# Patient Record
Sex: Female | Born: 1980
Health system: Southern US, Community
[De-identification: ages and names within clinical notes are randomized; demographics above are authoritative.]

## PROBLEM LIST (undated history)

## (undated) DIAGNOSIS — A599 Trichomoniasis, unspecified: Secondary | ICD-10-CM

## (undated) DIAGNOSIS — R87629 Unspecified abnormal cytological findings in specimens from vagina: Secondary | ICD-10-CM

## (undated) DIAGNOSIS — Z789 Other specified health status: Secondary | ICD-10-CM

## (undated) HISTORY — PX: NO PAST SURGERIES: SHX2092

---
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
## 2001-01-06 ENCOUNTER — Encounter: Admission: RE | Admit: 2001-01-06 | Discharge: 2001-01-06 | Payer: Self-pay | Admitting: Family Medicine

## 2001-01-13 ENCOUNTER — Encounter: Admission: RE | Admit: 2001-01-13 | Discharge: 2001-01-13 | Payer: Self-pay | Admitting: Family Medicine

## 2001-02-01 ENCOUNTER — Encounter: Admission: RE | Admit: 2001-02-01 | Discharge: 2001-02-01 | Payer: Self-pay | Admitting: Family Medicine

## 2001-02-01 ENCOUNTER — Other Ambulatory Visit: Admission: RE | Admit: 2001-02-01 | Discharge: 2001-02-01 | Payer: Self-pay | Admitting: Family Medicine

## 2001-03-01 ENCOUNTER — Encounter: Payer: Self-pay | Admitting: Emergency Medicine

## 2001-03-01 ENCOUNTER — Emergency Department (HOSPITAL_COMMUNITY): Admission: EM | Admit: 2001-03-01 | Discharge: 2001-03-01 | Payer: Self-pay | Admitting: Emergency Medicine

## 2001-04-16 ENCOUNTER — Emergency Department (HOSPITAL_COMMUNITY): Admission: EM | Admit: 2001-04-16 | Discharge: 2001-04-16 | Payer: Self-pay | Admitting: Emergency Medicine

## 2001-05-17 ENCOUNTER — Inpatient Hospital Stay (HOSPITAL_COMMUNITY): Admission: AD | Admit: 2001-05-17 | Discharge: 2001-05-17 | Payer: Self-pay | Admitting: *Deleted

## 2002-01-20 ENCOUNTER — Encounter: Admission: RE | Admit: 2002-01-20 | Discharge: 2002-01-20 | Payer: Self-pay | Admitting: Family Medicine

## 2002-03-07 ENCOUNTER — Encounter: Admission: RE | Admit: 2002-03-07 | Discharge: 2002-03-07 | Payer: Self-pay | Admitting: Family Medicine

## 2002-04-05 ENCOUNTER — Ambulatory Visit (HOSPITAL_COMMUNITY): Admission: RE | Admit: 2002-04-05 | Discharge: 2002-04-05 | Payer: Self-pay | Admitting: Family Medicine

## 2002-05-03 ENCOUNTER — Encounter: Admission: RE | Admit: 2002-05-03 | Discharge: 2002-05-03 | Payer: Self-pay | Admitting: Family Medicine

## 2002-05-29 ENCOUNTER — Encounter: Admission: RE | Admit: 2002-05-29 | Discharge: 2002-05-29 | Payer: Self-pay | Admitting: Family Medicine

## 2002-06-12 ENCOUNTER — Encounter: Admission: RE | Admit: 2002-06-12 | Discharge: 2002-06-12 | Payer: Self-pay | Admitting: Family Medicine

## 2002-06-26 ENCOUNTER — Encounter: Admission: RE | Admit: 2002-06-26 | Discharge: 2002-06-26 | Payer: Self-pay | Admitting: Sports Medicine

## 2002-07-24 ENCOUNTER — Encounter: Admission: RE | Admit: 2002-07-24 | Discharge: 2002-07-24 | Payer: Self-pay | Admitting: Family Medicine

## 2002-08-07 ENCOUNTER — Encounter: Admission: RE | Admit: 2002-08-07 | Discharge: 2002-08-07 | Payer: Self-pay | Admitting: Family Medicine

## 2002-08-11 ENCOUNTER — Encounter (INDEPENDENT_AMBULATORY_CARE_PROVIDER_SITE_OTHER): Payer: Self-pay

## 2002-08-11 ENCOUNTER — Inpatient Hospital Stay (HOSPITAL_COMMUNITY): Admission: AD | Admit: 2002-08-11 | Discharge: 2002-08-13 | Payer: Self-pay | Admitting: Obstetrics and Gynecology

## 2002-12-29 ENCOUNTER — Encounter: Admission: RE | Admit: 2002-12-29 | Discharge: 2002-12-29 | Payer: Self-pay | Admitting: Family Medicine

## 2003-01-12 ENCOUNTER — Encounter: Admission: RE | Admit: 2003-01-12 | Discharge: 2003-01-12 | Payer: Self-pay | Admitting: Family Medicine

## 2003-07-06 ENCOUNTER — Encounter: Admission: RE | Admit: 2003-07-06 | Discharge: 2003-07-06 | Payer: Self-pay | Admitting: Family Medicine

## 2004-02-05 ENCOUNTER — Encounter (INDEPENDENT_AMBULATORY_CARE_PROVIDER_SITE_OTHER): Payer: Self-pay | Admitting: *Deleted

## 2004-02-05 LAB — CONVERTED CEMR LAB

## 2004-02-08 ENCOUNTER — Encounter (INDEPENDENT_AMBULATORY_CARE_PROVIDER_SITE_OTHER): Payer: Self-pay | Admitting: Specialist

## 2004-02-08 ENCOUNTER — Other Ambulatory Visit: Admission: RE | Admit: 2004-02-08 | Discharge: 2004-02-08 | Payer: Self-pay | Admitting: Family Medicine

## 2004-02-08 ENCOUNTER — Encounter: Admission: RE | Admit: 2004-02-08 | Discharge: 2004-02-08 | Payer: Self-pay | Admitting: Family Medicine

## 2004-03-17 ENCOUNTER — Emergency Department (HOSPITAL_COMMUNITY): Admission: EM | Admit: 2004-03-17 | Discharge: 2004-03-17 | Payer: Self-pay | Admitting: Emergency Medicine

## 2005-02-04 ENCOUNTER — Inpatient Hospital Stay (HOSPITAL_COMMUNITY): Admission: AD | Admit: 2005-02-04 | Discharge: 2005-02-04 | Payer: Self-pay | Admitting: Obstetrics and Gynecology

## 2006-01-04 ENCOUNTER — Ambulatory Visit: Payer: Self-pay | Admitting: Family Medicine

## 2006-01-19 ENCOUNTER — Ambulatory Visit: Payer: Self-pay | Admitting: Sports Medicine

## 2006-01-22 ENCOUNTER — Ambulatory Visit: Payer: Self-pay | Admitting: Family Medicine

## 2006-01-29 ENCOUNTER — Emergency Department (HOSPITAL_COMMUNITY): Admission: EM | Admit: 2006-01-29 | Discharge: 2006-01-29 | Payer: Self-pay | Admitting: Emergency Medicine

## 2006-09-02 ENCOUNTER — Encounter (INDEPENDENT_AMBULATORY_CARE_PROVIDER_SITE_OTHER): Payer: Self-pay | Admitting: Specialist

## 2006-09-02 ENCOUNTER — Other Ambulatory Visit: Admission: RE | Admit: 2006-09-02 | Discharge: 2006-09-02 | Payer: Self-pay | Admitting: Family Medicine

## 2006-09-02 ENCOUNTER — Encounter (INDEPENDENT_AMBULATORY_CARE_PROVIDER_SITE_OTHER): Payer: Self-pay | Admitting: *Deleted

## 2006-09-02 ENCOUNTER — Ambulatory Visit: Payer: Self-pay | Admitting: Family Medicine

## 2006-09-02 DIAGNOSIS — A5601 Chlamydial cystitis and urethritis: Secondary | ICD-10-CM | POA: Insufficient documentation

## 2006-09-09 ENCOUNTER — Ambulatory Visit: Payer: Self-pay | Admitting: Family Medicine

## 2006-09-30 DIAGNOSIS — F17200 Nicotine dependence, unspecified, uncomplicated: Secondary | ICD-10-CM | POA: Insufficient documentation

## 2006-09-30 DIAGNOSIS — R8789 Other abnormal findings in specimens from female genital organs: Secondary | ICD-10-CM

## 2006-10-01 ENCOUNTER — Encounter (INDEPENDENT_AMBULATORY_CARE_PROVIDER_SITE_OTHER): Payer: Self-pay | Admitting: *Deleted

## 2006-10-06 LAB — CONVERTED CEMR LAB
Chlamydia, DNA Probe: POSITIVE — AB
GC Probe Amp, Genital: NEGATIVE

## 2007-02-09 ENCOUNTER — Ambulatory Visit: Payer: Self-pay | Admitting: Family Medicine

## 2007-02-11 ENCOUNTER — Ambulatory Visit: Payer: Self-pay | Admitting: Family Medicine

## 2007-06-12 ENCOUNTER — Emergency Department (HOSPITAL_COMMUNITY): Admission: EM | Admit: 2007-06-12 | Discharge: 2007-06-12 | Payer: Self-pay | Admitting: Emergency Medicine

## 2008-04-10 ENCOUNTER — Ambulatory Visit: Payer: Self-pay | Admitting: Family Medicine

## 2008-04-12 ENCOUNTER — Ambulatory Visit: Payer: Self-pay | Admitting: Family Medicine

## 2008-04-18 ENCOUNTER — Encounter (INDEPENDENT_AMBULATORY_CARE_PROVIDER_SITE_OTHER): Payer: Self-pay | Admitting: Family Medicine

## 2008-04-18 ENCOUNTER — Ambulatory Visit: Payer: Self-pay | Admitting: Family Medicine

## 2008-04-18 DIAGNOSIS — N898 Other specified noninflammatory disorders of vagina: Secondary | ICD-10-CM | POA: Insufficient documentation

## 2008-04-18 LAB — CONVERTED CEMR LAB
Chlamydia, DNA Probe: NEGATIVE
GC Probe Amp, Genital: NEGATIVE

## 2008-04-20 ENCOUNTER — Encounter: Payer: Self-pay | Admitting: *Deleted

## 2008-05-01 ENCOUNTER — Encounter (INDEPENDENT_AMBULATORY_CARE_PROVIDER_SITE_OTHER): Payer: Self-pay | Admitting: *Deleted

## 2008-05-10 ENCOUNTER — Telehealth (INDEPENDENT_AMBULATORY_CARE_PROVIDER_SITE_OTHER): Payer: Self-pay | Admitting: *Deleted

## 2008-06-01 ENCOUNTER — Ambulatory Visit: Payer: Self-pay | Admitting: Family Medicine

## 2008-06-01 DIAGNOSIS — B081 Molluscum contagiosum: Secondary | ICD-10-CM

## 2008-06-19 ENCOUNTER — Encounter (INDEPENDENT_AMBULATORY_CARE_PROVIDER_SITE_OTHER): Payer: Self-pay | Admitting: *Deleted

## 2008-07-03 ENCOUNTER — Encounter (INDEPENDENT_AMBULATORY_CARE_PROVIDER_SITE_OTHER): Payer: Self-pay | Admitting: *Deleted

## 2009-01-31 ENCOUNTER — Emergency Department (HOSPITAL_COMMUNITY): Admission: EM | Admit: 2009-01-31 | Discharge: 2009-01-31 | Payer: Self-pay | Admitting: Emergency Medicine

## 2009-04-19 ENCOUNTER — Ambulatory Visit: Payer: Self-pay | Admitting: Family Medicine

## 2009-04-22 ENCOUNTER — Ambulatory Visit: Payer: Self-pay | Admitting: Family Medicine

## 2009-12-11 ENCOUNTER — Emergency Department (HOSPITAL_COMMUNITY): Admission: EM | Admit: 2009-12-11 | Discharge: 2009-12-11 | Payer: Self-pay | Admitting: Emergency Medicine

## 2010-04-30 ENCOUNTER — Ambulatory Visit: Payer: Self-pay | Admitting: Family Medicine

## 2010-04-30 ENCOUNTER — Encounter: Payer: Self-pay | Admitting: *Deleted

## 2010-05-02 ENCOUNTER — Ambulatory Visit: Payer: Self-pay | Admitting: Family Medicine

## 2010-05-02 ENCOUNTER — Encounter: Payer: Self-pay | Admitting: *Deleted

## 2010-09-02 NOTE — Letter (Signed)
Summary: Generic Letter  [facility]  [address]   [place], [place] [address]   Phone: [phone]  Fax: [phone]    04/30/2010  [facility] [address] [place], [place]  [address]  To whom it may concern:  The above patient was seen in the clinic to recieve a TB test.   Please call the office if you have any other questions  Sincerely,   Jimmy Footman, CMA

## 2010-09-02 NOTE — Letter (Signed)
Summary: TB Skin Test  All     ,     Phone:   Fax:           TB Skin Test    Charlsie Hagerty    Date TB Test Placed:  ________________  L or R forearm  TB Test Placed by:  ___________________  Lot #:  __________________        Expiration Date: _____________  Date TB Test Read:  ____________________    Result ___________MM  TB Test Read by:  _______________

## 2010-09-02 NOTE — Assessment & Plan Note (Signed)
Summary: read tb/eo  Nurse Visit PPD READ ----- NEG.Jimmy Footman, CMA  May 02, 2010 4:22 PM   Vitals Entered By: Jimmy Footman, CMA (May 02, 2010 3:06 PM) CC: READ PPD   Allergies: No Known Drug Allergies  Orders Added: 1)  No Charge Patient Arrived (NCPA0) [NCPA0]

## 2010-09-02 NOTE — Assessment & Plan Note (Signed)
Summary: tb test,df  Nurse Visit  CC: tb test   Allergies: No Known Drug Allergies  Orders Added: 1)  TB Skin Test [86580]  Appended Document: tb test,df   Orders Added: 1)  TB Skin Test [86580] 2)  Admin 1st Vaccine [90471]      Immunizations Administered:  PPD Skin Test:    Vaccine Type: PPD    Site: left forearm    Mfr: Sanofi Pasteur    Dose: 0.05 ml    Route: ID    Given by: Jimmy Footman, CMA    Exp. Date: 02/07/2012    Lot #: [id number]

## 2010-10-11 ENCOUNTER — Emergency Department (HOSPITAL_COMMUNITY)
Admission: EM | Admit: 2010-10-11 | Discharge: 2010-10-11 | Disposition: A | Discharge status: Home / Self Care | Payer: Self-pay | Attending: Emergency Medicine | Admitting: Emergency Medicine

## 2010-10-11 DIAGNOSIS — B9689 Other specified bacterial agents as the cause of diseases classified elsewhere: Secondary | ICD-10-CM | POA: Insufficient documentation

## 2010-10-11 DIAGNOSIS — N390 Urinary tract infection, site not specified: Secondary | ICD-10-CM | POA: Insufficient documentation

## 2010-10-11 DIAGNOSIS — A499 Bacterial infection, unspecified: Secondary | ICD-10-CM | POA: Insufficient documentation

## 2010-10-11 DIAGNOSIS — N760 Acute vaginitis: Secondary | ICD-10-CM | POA: Insufficient documentation

## 2010-10-11 LAB — URINALYSIS, ROUTINE W REFLEX MICROSCOPIC
Bilirubin Urine: NEGATIVE
Glucose, UA: NEGATIVE mg/dL
Hgb urine dipstick: NEGATIVE
Ketones, ur: NEGATIVE mg/dL
Nitrite: NEGATIVE
Protein, ur: NEGATIVE mg/dL
Specific Gravity, Urine: 1.028 (ref 1.005–1.030)
Urobilinogen, UA: 1 mg/dL (ref 0.0–1.0)
pH: 6 (ref 5.0–8.0)

## 2010-10-11 LAB — URINE MICROSCOPIC-ADD ON

## 2010-10-11 LAB — WET PREP, GENITAL: Clue Cells Wet Prep HPF POC: NONE SEEN

## 2010-10-11 LAB — POCT PREGNANCY, URINE: Preg Test, Ur: NEGATIVE

## 2010-10-14 LAB — GC/CHLAMYDIA PROBE AMP, GENITAL
Chlamydia, DNA Probe: POSITIVE — AB
GC Probe Amp, Genital: NEGATIVE

## 2010-10-21 LAB — URINALYSIS, ROUTINE W REFLEX MICROSCOPIC
Bilirubin Urine: NEGATIVE
Ketones, ur: NEGATIVE mg/dL
Nitrite: NEGATIVE
Urobilinogen, UA: 1 mg/dL (ref 0.0–1.0)

## 2010-10-21 LAB — PREGNANCY, URINE: Preg Test, Ur: NEGATIVE

## 2010-10-21 LAB — URINE MICROSCOPIC-ADD ON

## 2010-10-30 ENCOUNTER — Telehealth: Payer: Self-pay | Admitting: *Deleted

## 2010-10-30 NOTE — Telephone Encounter (Signed)
Pt was seen for spilling boiling water on her foot.  Dr Caryl Never gave her cream and she is finished taking it.  The blisters are getting bigger and she is leaving for [place] Saturday.  Could she get a refill on the cream or what would you recommend. [facility]

## 2010-10-30 NOTE — Telephone Encounter (Signed)
It is not unusual for the blisters to enlarge somewhat after a few days.  Needs to continue with Silvadene cream and would call in rx for her to use on her trip.  Main thing to watch for is any signs of infection.  This telephone note should be entered into chart of Jennifer Cabrera DOB [birth year] who is the patient I saw 2 days ago.

## 2010-11-09 LAB — POCT PREGNANCY, URINE: Preg Test, Ur: NEGATIVE

## 2010-11-09 LAB — URINALYSIS, ROUTINE W REFLEX MICROSCOPIC
Glucose, UA: NEGATIVE mg/dL
Hgb urine dipstick: NEGATIVE
Specific Gravity, Urine: 1.026 (ref 1.005–1.030)
Urobilinogen, UA: 1 mg/dL (ref 0.0–1.0)

## 2010-11-09 LAB — WET PREP, GENITAL
WBC, Wet Prep HPF POC: NONE SEEN
Yeast Wet Prep HPF POC: NONE SEEN

## 2011-05-12 LAB — URINE CULTURE: Colony Count: >=100000

## 2011-05-12 LAB — URINALYSIS, ROUTINE W REFLEX MICROSCOPIC
Bilirubin Urine: NEGATIVE
Glucose, UA: NEGATIVE
Ketones, ur: NEGATIVE
Specific Gravity, Urine: 1.033 — ABNORMAL HIGH
pH: 6.5

## 2011-05-12 LAB — URINE MICROSCOPIC-ADD ON

## 2011-06-23 ENCOUNTER — Inpatient Hospital Stay (HOSPITAL_COMMUNITY)
Admission: AD | Admit: 2011-06-23 | Discharge: 2011-06-24 | Disposition: A | Discharge status: Home / Self Care | Payer: Self-pay | Source: Ambulatory Visit | Attending: Family Medicine | Admitting: Family Medicine

## 2011-06-23 DIAGNOSIS — N760 Acute vaginitis: Secondary | ICD-10-CM | POA: Insufficient documentation

## 2011-06-23 DIAGNOSIS — B9689 Other specified bacterial agents as the cause of diseases classified elsewhere: Secondary | ICD-10-CM

## 2011-06-23 DIAGNOSIS — A499 Bacterial infection, unspecified: Secondary | ICD-10-CM

## 2011-06-23 DIAGNOSIS — R109 Unspecified abdominal pain: Secondary | ICD-10-CM | POA: Insufficient documentation

## 2011-06-23 HISTORY — DX: Other specified health status: Z78.9

## 2011-06-23 NOTE — Progress Notes (Signed)
Pt states, " I am five days late for my period and I have a smelly discharge and cramping in my low abdomen for three days" .

## 2011-06-24 ENCOUNTER — Encounter (HOSPITAL_COMMUNITY): Payer: Self-pay | Admitting: *Deleted

## 2011-06-24 LAB — WET PREP, GENITAL
Trich, Wet Prep: NONE SEEN
Yeast Wet Prep HPF POC: NONE SEEN

## 2011-06-24 LAB — URINALYSIS, ROUTINE W REFLEX MICROSCOPIC
Glucose, UA: NEGATIVE mg/dL
Leukocytes, UA: NEGATIVE
Nitrite: NEGATIVE
Specific Gravity, Urine: >1.03 — ABNORMAL HIGH (ref 1.005–1.030)
pH: 6 (ref 5.0–8.0)

## 2011-06-24 MED ORDER — METRONIDAZOLE 500 MG PO TABS: 500.0000 mg | ORAL_TABLET | Freq: Two times a day (BID) | ORAL | Status: AC

## 2011-06-24 NOTE — ED Provider Notes (Signed)
Chart reviewed and agree with management and plan.  

## 2011-06-24 NOTE — Progress Notes (Signed)
SSE done per NP. Wet prep and cultures collected. VE done.   

## 2011-06-24 NOTE — ED Provider Notes (Signed)
History     CSN: [id number] Arrival date & time: 06/23/2011 11:07 PM   None     Chief Complaint  Patient presents with  . Abdominal Pain  . Vaginal Discharge    HPI Jennifer Cabrera is a 30 y.o. female who presents to MAU for vaginal discharge. She states that it has a smell like when she had BV before. She is also late for her period and wants a pregnancy test. The history was provided by the patient.  Past Medical History  Diagnosis Date  . No pertinent past medical history     Past Surgical History  Procedure Date  . No past surgeries     History reviewed. No pertinent family history.  History  Substance Use Topics  . Smoking status: Never Smoker   . Smokeless tobacco: Not on file  . Alcohol Use: No    OB History    Grav Para Term Preterm Abortions TAB SAB Ect Mult Living   2 2 0 0 0 0 0 0 0 2       Review of Systems  Gastrointestinal: Negative for nausea, vomiting, diarrhea and constipation.  Genitourinary: Positive for vaginal discharge. Negative for dysuria, frequency, vaginal bleeding and vaginal pain. Pelvic pain: cramping.  Psychiatric/Behavioral: Negative for confusion and agitation.    Allergies  Review of patient's allergies indicates no known allergies.  Home Medications  No current outpatient prescriptions on file.  BP 105/70  Pulse 74  Temp(Src) 98.6 F (37 C) (Oral)  Resp 18  Ht 5\' 6"  (1.676 m)  Wt 140 lb 4 oz (63.617 kg)  BMI 22.64 kg/m2  LMP 05/19/2011  Physical Exam  Nursing note and vitals reviewed. Constitutional: She is oriented to person, place, and time. She appears well-developed and well-nourished.  HENT:  Head: Normocephalic.  Eyes: EOM are normal.  Neck: Neck supple.  Cardiovascular: Normal rate.   Pulmonary/Chest: Effort normal.  Abdominal: Soft. There is no tenderness.  Genitourinary:       External genitalia without lesions. Frothy discharge vaginal vault. No CMT, no adnexal tenderness, uterus without palpable  enlargement.  Musculoskeletal: Normal range of motion.  Neurological: She is alert and oriented to person, place, and time. No cranial nerve deficit.  Skin: Skin is warm and dry.  Psychiatric: She has a normal mood and affect. Her behavior is normal. Judgment and thought content normal.    Results for orders placed during the hospital encounter of 06/23/11 (from the past 24 hour(s))  URINALYSIS, ROUTINE W REFLEX MICROSCOPIC     Status: Abnormal   Collection Time   06/23/11 11:25 AM      Component Value Range   Color, Urine YELLOW  YELLOW    Appearance CLEAR  CLEAR    Specific Gravity, Urine >1.030 (*) 1.005 - 1.030    pH 6.0  5.0 - 8.0    Glucose, UA NEGATIVE  NEGATIVE (mg/dL)   Hgb urine dipstick NEGATIVE  NEGATIVE    Bilirubin Urine NEGATIVE  NEGATIVE    Ketones, ur 15 (*) NEGATIVE (mg/dL)   Protein, ur NEGATIVE  NEGATIVE (mg/dL)   Urobilinogen, UA 1.0  0.0 - 1.0 (mg/dL)   Nitrite NEGATIVE  NEGATIVE    Leukocytes, UA NEGATIVE  NEGATIVE   WET PREP, GENITAL     Status: Abnormal   Collection Time   06/24/11  3:22 AM      Component Value Range   Yeast, Wet Prep NONE SEEN  NONE SEEN    Trich, Wet Prep  NONE SEEN  NONE SEEN    Clue Cells, Wet Prep FEW (*) NONE SEEN    WBC, Wet Prep HPF POC MANY (*) NONE SEEN    Assessment: Bacterial vaginosis  Plan:  Flagyl 500 mg po bid x 7 day   Follow up with Health department   GC, Chlamydia cultures pending. ED Course  Procedures   MDM          Kerrie Buffalo, NP 06/24/11 [phone]

## 2011-06-24 NOTE — Progress Notes (Signed)
H. Neese, NP at bedside.  Assessment done and poc discussed with pt.  

## 2011-06-25 LAB — GC/CHLAMYDIA PROBE AMP, GENITAL: GC Probe Amp, Genital: NEGATIVE

## 2012-02-10 ENCOUNTER — Ambulatory Visit: Payer: Self-pay | Admitting: Family Medicine

## 2012-02-19 ENCOUNTER — Encounter (HOSPITAL_COMMUNITY): Payer: Self-pay | Admitting: Emergency Medicine

## 2012-02-19 ENCOUNTER — Emergency Department (INDEPENDENT_AMBULATORY_CARE_PROVIDER_SITE_OTHER)
Admission: EM | Admit: 2012-02-19 | Discharge: 2012-02-19 | Disposition: A | Discharge status: Home / Self Care | Payer: Self-pay | Source: Home / Self Care | Attending: Emergency Medicine | Admitting: Emergency Medicine

## 2012-02-19 DIAGNOSIS — L301 Dyshidrosis [pompholyx]: Secondary | ICD-10-CM

## 2012-02-19 MED ORDER — TRIAMCINOLONE ACETONIDE 0.1 % EX CREA: TOPICAL_CREAM | Freq: Two times a day (BID) | CUTANEOUS | Status: AC

## 2012-02-19 MED ORDER — TRIAMCINOLONE ACETONIDE 0.1 % EX CREA: TOPICAL_CREAM | Freq: Two times a day (BID) | CUTANEOUS | Status: DC

## 2012-02-19 NOTE — ED Notes (Signed)
Pt stated that she is unsure of what is on her right arm but it itches. There are 2 small blisters on inner aspect of right arm and dark colored raised areas in multiple areas of right arm . Pt stated that they "pop up and the clear up and then they start popping up again" .Pt stated that the ones that are clearing up now on her arm had a small amount of clear drainage. Denies pain.

## 2012-02-19 NOTE — ED Provider Notes (Signed)
History     CSN: [id number]  Arrival date & time 02/19/12  1606   First MD Initiated Contact with Patient 02/19/12 1607      Chief Complaint  Patient presents with  . Rash    (Consider location/radiation/quality/duration/timing/severity/associated sxs/prior treatment) HPI Comments: I have been having this rash on and off for about 2 weeks. I tend to mixed Clorox with other chemicals and I thought initially that it was related to mixing chemicals" latter I also thought it was poison oak but some of the over-the-counter medicines I used didn't work. Have a couple of new blisters on the outer aspect of her right hand and fingers. They look like they're filled with fluid but they're not red, or tender " today just itch"She denies any other systemic symptoms such as fevers generalized malaise, arthralgias, myalgias headaches. No other family member at home has any similar rashes including her 2 kids present during exam   Patient is a 31 y.o. female presenting with rash.  Rash  This is a new problem. The current episode started more than 1 week ago. The problem has not changed since onset.There has been no fever. The rash is present on the right arm, right hand and left arm. The pain is mild. The pain has been constant since onset. Associated symptoms include blisters and itching. Pertinent negatives include no weeping. She has tried antibiotic cream and anti-itch cream for the symptoms. The treatment provided no relief.    Past Medical History  Diagnosis Date  . No pertinent past medical history     Past Surgical History  Procedure Date  . No past surgeries     No family history on file.  History  Substance Use Topics  . Smoking status: Never Smoker   . Smokeless tobacco: Not on file  . Alcohol Use: No    OB History    Grav Para Term Preterm Abortions TAB SAB Ect Mult Living   2 2 0 0 0 0 0 0 0 2       Review of Systems  Constitutional: Negative for fever, diaphoresis,  activity change, appetite change and fatigue.  Musculoskeletal: Negative for myalgias, joint swelling and arthralgias.  Skin: Positive for itching and rash. Negative for color change, pallor and wound.    Allergies  Review of patient's allergies indicates no known allergies.  Home Medications   Current Outpatient Rx  Name Route Sig Dispense Refill  . TRIAMCINOLONE ACETONIDE 0.1 % EX CREA Topical Apply topically 2 (two) times daily. Applied twice a day for 2 weeks 30 g 0    BP 111/64  Pulse 78  Temp 98.7 F (37.1 C) (Oral)  Resp 16  SpO2 100%  LMP 02/17/2012  Physical Exam  Skin: Skin is warm and dry. Rash noted. No abrasion, no bruising, no burn, no ecchymosis and no lesion noted. Rash is vesicular. Rash is not papular, not maculopapular and not nodular. She is not diaphoretic. No cyanosis or erythema. No pallor. Nails show no clubbing.       ED Course  Procedures (including critical care time)  Labs Reviewed - No data to display No results found.   1. Dyshidrotic dermatitis       MDM   Multiple clear vesicular lesions without erythema surrounding them for rehabilitation. Recurrent for 3 weeks. Patient has been trying to fold over the counter creams. Today prescription for triamcinolone cream was otherwise to use for 2 weeks and if no resolution to followup with dermatologist patient  agree with treatment plan and followup care as necessary. This vesicular eruption does not seem to be consistent with herpes simplex or molluscum contagiosum macroscopically similar.      Jimmie Molly, MD 02/19/12 [phone]

## 2012-03-07 ENCOUNTER — Encounter: Payer: Self-pay | Admitting: Family Medicine

## 2012-03-07 ENCOUNTER — Ambulatory Visit (INDEPENDENT_AMBULATORY_CARE_PROVIDER_SITE_OTHER): Payer: Self-pay | Admitting: Family Medicine

## 2012-03-07 ENCOUNTER — Other Ambulatory Visit (HOSPITAL_COMMUNITY)
Admission: RE | Admit: 2012-03-07 | Discharge: 2012-03-07 | Disposition: A | Discharge status: Home / Self Care | Payer: Self-pay | Source: Ambulatory Visit | Attending: Family Medicine | Admitting: Family Medicine

## 2012-03-07 VITALS — BP 95/59 | HR 82 | Temp 98.5°F | Ht 66.0 in | Wt 139.0 lb

## 2012-03-07 DIAGNOSIS — Z01419 Encounter for gynecological examination (general) (routine) without abnormal findings: Secondary | ICD-10-CM | POA: Insufficient documentation

## 2012-03-07 DIAGNOSIS — Z113 Encounter for screening for infections with a predominantly sexual mode of transmission: Secondary | ICD-10-CM | POA: Insufficient documentation

## 2012-03-07 DIAGNOSIS — R87810 Cervical high risk human papillomavirus (HPV) DNA test positive: Secondary | ICD-10-CM | POA: Insufficient documentation

## 2012-03-07 DIAGNOSIS — N898 Other specified noninflammatory disorders of vagina: Secondary | ICD-10-CM

## 2012-03-07 LAB — POCT WET PREP (WET MOUNT): Clue Cells Wet Prep Whiff POC: NEGATIVE

## 2012-03-07 LAB — POCT URINE PREGNANCY: Preg Test, Ur: NEGATIVE

## 2012-03-07 MED ORDER — METRONIDAZOLE 500 MG PO TABS: 500.0000 mg | ORAL_TABLET | Freq: Two times a day (BID) | ORAL | Status: AC

## 2012-03-07 MED ORDER — METRONIDAZOLE 500 MG PO TABS: 500.0000 mg | ORAL_TABLET | Freq: Two times a day (BID) | ORAL | Status: DC

## 2012-03-07 MED ORDER — NORGESTIMATE-ETH ESTRADIOL 0.25-35 MG-MCG PO TABS: 1.0000 | ORAL_TABLET | Freq: Every day | ORAL | Status: DC

## 2012-03-07 NOTE — Patient Instructions (Addendum)
It was good to see you today. I have sent in your prescriptions for you to the pharmacy. If  You need anything, please let me know!  I will let you know the results of your pap smear.  Take care! Jennifer Cabrera M. Fani Rotondo, M.D.

## 2012-03-08 DIAGNOSIS — Z01419 Encounter for gynecological examination (general) (routine) without abnormal findings: Secondary | ICD-10-CM | POA: Insufficient documentation

## 2012-03-08 DIAGNOSIS — A599 Trichomoniasis, unspecified: Secondary | ICD-10-CM | POA: Insufficient documentation

## 2012-03-08 NOTE — Progress Notes (Signed)
  Subjective:     Jennifer Cabrera is a 31 y.o. female and is here for a comprehensive physical exam. She has not received care in many years. The patient reports problems - currently having vaginal odor. Denies discharge, itching, pain with intercourse or any known exposure to STD. She currently has one partner. She does not have any form of contraception currently but would like to start something today. Her last pap was in 2009 and she is unsure of the results of that..  History   Social History  . Marital Status: Single    Spouse Name: N/A    Number of Children: N/A  . Years of Education: N/A   Occupational History  . Not on file.   Social History Main Topics  . Smoking status: Never Smoker   . Smokeless tobacco: Not on file  . Alcohol Use: No  . Drug Use: No  . Sexually Active: Yes    Birth Control/ Protection: None   Other Topics Concern  . Not on file   Social History Narrative  . No narrative on file   Health Maintenance  Topic Date Due  . Pap Smear  12/02/1999  . Tetanus/tdap  12/01/2000  . Influenza Vaccine  05/03/2012    The following portions of the patient's history were reviewed and updated as appropriate: allergies, current medications, past family history, past medical history, past social history, past surgical history and problem list.  Review of Systems Pertinent items are noted in HPI.   Objective:    BP 95/59  Pulse 82  Temp 98.5 F (36.9 C) (Oral)  Ht 5\' 6"  (1.676 m)  Wt 139 lb (63.05 kg)  BMI 22.44 kg/m2  LMP 02/17/2012 General appearance: alert, cooperative and no distress Head: Normocephalic, without obvious abnormality, atraumatic Lungs: clear to auscultation bilaterally Heart: regular rate and rhythm, S1, S2 normal, no murmur, click, rub or gallop Abdomen: soft, non-tender; bowel sounds normal; no masses,  no organomegaly Pelvic: cervix normal in appearance, external genitalia normal, no adnexal masses or tenderness, no cervical motion  tenderness, rectovaginal septum normal, uterus normal size, shape, and consistency and vagina normal without discharge Extremities: extremities normal, atraumatic, no cyanosis or edema Skin: Skin color, texture, turgor normal. No rashes or lesions Neurologic: Grossly normal    Assessment:    Healthy female exam.     Plan:

## 2012-03-08 NOTE — Assessment & Plan Note (Signed)
No discharge noted on exam. Wet prep collected. Patient has a history of BV; will treat with Flagyl x 1 week. Advised patient to avoid soaps/perfumes that could also cause odor. Will check for GC/Ch on pap smear. If patient develops fever, abd pain, worsening discharge, she should return for re-evaluation.

## 2012-03-08 NOTE — Assessment & Plan Note (Signed)
Pap smear collected today with HPV testing (over age 30.) Gc/Ch also tested on pap smear. Urine pregnancy test negative today 3 weeks after last period. Will start Sprintec OCP today. Patient agrees.

## 2012-03-14 ENCOUNTER — Telehealth: Payer: Self-pay | Admitting: Family Medicine

## 2012-03-14 ENCOUNTER — Encounter: Payer: Self-pay | Admitting: Family Medicine

## 2012-03-14 DIAGNOSIS — IMO0001 Reserved for inherently not codable concepts without codable children: Secondary | ICD-10-CM | POA: Insufficient documentation

## 2012-03-14 NOTE — Telephone Encounter (Signed)
Patient given report of abnormal pap. She has had abnormal pap smears in the past. Will make an appointment for colposcopy at [facility] clinic. Patient states understanding.  Letter sent with results.  Caidan Hubbert M. Eman Morimoto, M.D. 03/14/2012 12:03 PM

## 2013-04-26 ENCOUNTER — Encounter (HOSPITAL_COMMUNITY): Payer: Self-pay

## 2013-04-26 ENCOUNTER — Inpatient Hospital Stay (HOSPITAL_COMMUNITY)
Admission: AD | Admit: 2013-04-26 | Discharge: 2013-04-26 | Disposition: A | Discharge status: Home / Self Care | Payer: Self-pay | Source: Ambulatory Visit | Attending: Obstetrics and Gynecology | Admitting: Obstetrics and Gynecology

## 2013-04-26 DIAGNOSIS — A088 Other specified intestinal infections: Secondary | ICD-10-CM | POA: Insufficient documentation

## 2013-04-26 DIAGNOSIS — A499 Bacterial infection, unspecified: Secondary | ICD-10-CM | POA: Insufficient documentation

## 2013-04-26 DIAGNOSIS — E876 Hypokalemia: Secondary | ICD-10-CM | POA: Insufficient documentation

## 2013-04-26 DIAGNOSIS — A084 Viral intestinal infection, unspecified: Secondary | ICD-10-CM

## 2013-04-26 DIAGNOSIS — R109 Unspecified abdominal pain: Secondary | ICD-10-CM | POA: Insufficient documentation

## 2013-04-26 DIAGNOSIS — N760 Acute vaginitis: Secondary | ICD-10-CM | POA: Insufficient documentation

## 2013-04-26 DIAGNOSIS — B9689 Other specified bacterial agents as the cause of diseases classified elsewhere: Secondary | ICD-10-CM | POA: Insufficient documentation

## 2013-04-26 LAB — CBC
HCT: 32.1 % — ABNORMAL LOW (ref 36.0–46.0)
MCV: 70.7 fL — ABNORMAL LOW (ref 78.0–100.0)
RBC: 4.54 MIL/uL (ref 3.87–5.11)
RDW: 15 % (ref 11.5–15.5)
WBC: 3.9 10*3/uL — ABNORMAL LOW (ref 4.0–10.5)

## 2013-04-26 LAB — COMPREHENSIVE METABOLIC PANEL
ALT: 8 U/L (ref 0–35)
AST: 12 U/L (ref 0–37)
Albumin: 3.6 g/dL (ref 3.5–5.2)
Alkaline Phosphatase: 41 U/L (ref 39–117)
BUN: 11 mg/dL (ref 6–23)
Chloride: 102 mEq/L (ref 96–112)
Creatinine, Ser: 0.74 mg/dL (ref 0.50–1.10)
Potassium: 3.3 mEq/L — ABNORMAL LOW (ref 3.5–5.1)
Sodium: 137 mEq/L (ref 135–145)
Total Bilirubin: 0.2 mg/dL — ABNORMAL LOW (ref 0.3–1.2)
Total Protein: 6.9 g/dL (ref 6.0–8.3)

## 2013-04-26 LAB — URINALYSIS, ROUTINE W REFLEX MICROSCOPIC
Glucose, UA: NEGATIVE mg/dL
Ketones, ur: NEGATIVE mg/dL
Leukocytes, UA: NEGATIVE
Nitrite: NEGATIVE
Protein, ur: NEGATIVE mg/dL

## 2013-04-26 LAB — HCG, SERUM, QUALITATIVE: Preg, Serum: NEGATIVE

## 2013-04-26 LAB — WET PREP, GENITAL
Trich, Wet Prep: NONE SEEN
Yeast Wet Prep HPF POC: NONE SEEN

## 2013-04-26 MED ORDER — METRONIDAZOLE 500 MG PO TABS: 500.0000 mg | ORAL_TABLET | Freq: Two times a day (BID) | ORAL | Status: DC

## 2013-04-26 MED ORDER — POTASSIUM CHLORIDE ER 10 MEQ PO TBCR: 10.0000 meq | EXTENDED_RELEASE_TABLET | Freq: Two times a day (BID) | ORAL | Status: DC

## 2013-04-26 MED ORDER — PROMETHAZINE HCL 25 MG PO TABS: 25.0000 mg | ORAL_TABLET | Freq: Four times a day (QID) | ORAL | Status: DC | PRN

## 2013-04-26 NOTE — MAU Note (Addendum)
Patient is in with c/o lower abdominal cramping and vomiting. She states that she had spotting yesterday after intercourse. Patient states that she feels like she is pregnant, though home pregnancy test are negative. lmp 9/13. Patient wants a confirmation serum pregnancy test.

## 2013-04-26 NOTE — MAU Provider Note (Signed)
History     CSN: [id number]  Arrival date and time: 04/26/13 1754   First Provider Initiated Contact with Patient 04/26/13 2026      Chief Complaint  Patient presents with  . Emesis  . Abdominal Pain  . Headache  . Possible Pregnancy   HPI Jennifer Cabrera is a 32 y.o. G2P0002 who presents to MAU today with 3 days of N/V and lower abdominal cramping. She has also had mild headache rated at 5/10 now. She states a history of subjective fevers and increased gas. She denies diarrhea or constipation. She has been taking ibuprofen for pain, last dose was around noon today. LMP was 03/15/13. She had spotting after intercourse last night that has resolved. She denies a history of irregular menses.   OB History   Grav Para Term Preterm Abortions TAB SAB Ect Mult Living   2 2 0 0 0 0 0 0 0 2       Past Medical History  Diagnosis Date  . No pertinent past medical history     Past Surgical History  Procedure Laterality Date  . No past surgeries      History reviewed. No pertinent family history.  History  Substance Use Topics  . Smoking status: Never Smoker   . Smokeless tobacco: Not on file  . Alcohol Use: No    Allergies: No Known Allergies  No prescriptions prior to admission    Review of Systems  Constitutional: Negative for fever and malaise/fatigue.  Gastrointestinal: Positive for nausea, vomiting and abdominal pain. Negative for diarrhea and constipation.  Genitourinary: Negative for dysuria, urgency and frequency.       Neg - vaginal bleeding, discharge   Physical Exam   Blood pressure 118/76, pulse 72, temperature 98.3 F (36.8 C), temperature source Oral, resp. rate 18, height 5\' 6"  (1.676 m), weight 155 lb (70.308 kg), last menstrual period 03/15/2013.  Physical Exam  Constitutional: She is oriented to person, place, and time. She appears well-developed and well-nourished. No distress.  HENT:  Head: Normocephalic and atraumatic.  Cardiovascular: Normal  rate, regular rhythm and normal heart sounds.   Respiratory: Effort normal and breath sounds normal. No respiratory distress.  GI: Soft. Bowel sounds are normal. She exhibits no distension and no mass. There is tenderness (mild tenderness to palpation of the lower abdomen bilaterally). There is no rebound and no guarding.  Genitourinary: Uterus is not enlarged and not tender. Cervix exhibits friability. Cervix exhibits no motion tenderness and no discharge. Right adnexum displays no mass and no tenderness. Left adnexum displays no mass and no tenderness. No bleeding around the vagina. Vaginal discharge (small amount of thin, white blood noted in the vaginal vault) found.  Neurological: She is alert and oriented to person, place, and time.  Skin: Skin is warm and dry. No erythema.  Psychiatric: She has a normal mood and affect.   Results for orders placed during the hospital encounter of 04/26/13 (from the past 24 hour(s))  URINALYSIS, ROUTINE W REFLEX MICROSCOPIC     Status: Abnormal   Collection Time    04/26/13  7:00 PM      Result Value Range   Color, Urine YELLOW  YELLOW   APPearance CLEAR  CLEAR   Specific Gravity, Urine >1.030 (*) 1.005 - 1.030   pH 6.0  5.0 - 8.0   Glucose, UA NEGATIVE  NEGATIVE mg/dL   Hgb urine dipstick NEGATIVE  NEGATIVE   Bilirubin Urine NEGATIVE  NEGATIVE   Ketones, ur  NEGATIVE  NEGATIVE mg/dL   Protein, ur NEGATIVE  NEGATIVE mg/dL   Urobilinogen, UA 1.0  0.0 - 1.0 mg/dL   Nitrite NEGATIVE  NEGATIVE   Leukocytes, UA NEGATIVE  NEGATIVE  POCT PREGNANCY, URINE     Status: None   Collection Time    04/26/13  7:59 PM      Result Value Range   Preg Test, Ur NEGATIVE  NEGATIVE  WET PREP, GENITAL     Status: Abnormal   Collection Time    04/26/13  8:35 PM      Result Value Range   Yeast Wet Prep HPF POC NONE SEEN  NONE SEEN   Trich, Wet Prep NONE SEEN  NONE SEEN   Clue Cells Wet Prep HPF POC MODERATE (*) NONE SEEN   WBC, Wet Prep HPF POC FEW (*) NONE SEEN   CBC     Status: Abnormal   Collection Time    04/26/13  8:44 PM      Result Value Range   WBC 3.9 (*) 4.0 - 10.5 K/uL   RBC 4.54  3.87 - 5.11 MIL/uL   Hemoglobin 10.1 (*) 12.0 - 15.0 g/dL   HCT 14.7 (*) 82.9 - 56.2 %   MCV 70.7 (*) 78.0 - 100.0 fL   MCH 22.2 (*) 26.0 - 34.0 pg   MCHC 31.5  30.0 - 36.0 g/dL   RDW 13.0  86.5 - 78.4 %   Platelets 179  150 - 400 K/uL  COMPREHENSIVE METABOLIC PANEL     Status: Abnormal   Collection Time    04/26/13  8:44 PM      Result Value Range   Sodium 137  135 - 145 mEq/L   Potassium 3.3 (*) 3.5 - 5.1 mEq/L   Chloride 102  96 - 112 mEq/L   CO2 24  19 - 32 mEq/L   Glucose, Bld 75  70 - 99 mg/dL   BUN 11  6 - 23 mg/dL   Creatinine, Ser 6.96  0.50 - 1.10 mg/dL   Calcium 9.1  8.4 - 29.5 mg/dL   Total Protein 6.9  6.0 - 8.3 g/dL   Albumin 3.6  3.5 - 5.2 g/dL   AST 12  0 - 37 U/L   ALT 8  0 - 35 U/L   Alkaline Phosphatase 41  39 - 117 U/L   Total Bilirubin 0.2 (*) 0.3 - 1.2 mg/dL   GFR calc non Af Amer >90  >90 mL/min   GFR calc Af Amer >90  >90 mL/min  HCG, SERUM, QUALITATIVE     Status: None   Collection Time    04/26/13  8:44 PM      Result Value Range   Preg, Serum NEGATIVE  NEGATIVE    MAU Course  Procedures None  MDM UPT - negative UA, Wet prep, GC/Chlamydia, serum hCG, CBC and CMP today  Assessment and Plan  A: Acute viral gastroenteritis Bacterial vaginosis Hypokalemia  P: Discharge home Rx for Phenergan, K-Dur and Flagyl sent to patient's pharmacy Information on BRAT diet given to patient Patient encouraged to take HPT in ~ 2 weeks if she does not have a period and follow-up accordingly with her PCP at [facility] Patient may return to MAU as needed or if her condition were to change or worsen  Freddi Starr, PA-C  04/26/2013, 9:57 PM

## 2013-04-26 NOTE — MAU Note (Signed)
Been sick, can't keep much down, started 3 days ago.  Pain in bottom of stomach, cramping like period is going to start for the past wk.  Period is late, did one HPT a few days ago- it was neg..  Having light headaches.

## 2013-04-27 LAB — GC/CHLAMYDIA PROBE AMP
CT Probe RNA: NEGATIVE
GC Probe RNA: POSITIVE — AB

## 2013-04-30 NOTE — MAU Provider Note (Signed)
Attestation of Attending Supervision of Advanced Practitioner: Evaluation and management procedures were performed by the PA/NP/CNM/OB Fellow under my supervision/collaboration. Chart reviewed and agree with management and plan.  Tilda Burrow 04/30/2013 6:36 PM

## 2013-05-09 ENCOUNTER — Ambulatory Visit (INDEPENDENT_AMBULATORY_CARE_PROVIDER_SITE_OTHER): Payer: Self-pay | Admitting: *Deleted

## 2013-05-09 DIAGNOSIS — Z111 Encounter for screening for respiratory tuberculosis: Secondary | ICD-10-CM

## 2013-05-09 NOTE — Progress Notes (Signed)
Tuberculin skin test applied to left ventral forearm. Explained how to read the test, measuring induration not just erythema; patient will return to office in 48-72 hours to have test read.

## 2013-05-12 ENCOUNTER — Ambulatory Visit (INDEPENDENT_AMBULATORY_CARE_PROVIDER_SITE_OTHER): Payer: Self-pay | Admitting: *Deleted

## 2013-05-12 DIAGNOSIS — Z111 Encounter for screening for respiratory tuberculosis: Secondary | ICD-10-CM

## 2013-05-12 LAB — TB SKIN TEST
Induration: 0 mm
TB Skin Test: NEGATIVE

## 2013-05-12 NOTE — Progress Notes (Signed)
>  72 hrs have passed since test, PPD placed again in left ventral forearm, patient will return on Monday morning to have test read.

## 2013-05-15 ENCOUNTER — Encounter: Payer: Self-pay | Admitting: *Deleted

## 2013-05-15 ENCOUNTER — Ambulatory Visit (INDEPENDENT_AMBULATORY_CARE_PROVIDER_SITE_OTHER): Payer: Self-pay | Admitting: *Deleted

## 2013-05-15 DIAGNOSIS — Z111 Encounter for screening for respiratory tuberculosis: Secondary | ICD-10-CM

## 2014-06-04 ENCOUNTER — Encounter (HOSPITAL_COMMUNITY): Payer: Self-pay

## 2014-06-06 ENCOUNTER — Ambulatory Visit (INDEPENDENT_AMBULATORY_CARE_PROVIDER_SITE_OTHER): Payer: Self-pay | Admitting: *Deleted

## 2014-06-06 DIAGNOSIS — Z111 Encounter for screening for respiratory tuberculosis: Secondary | ICD-10-CM

## 2014-06-06 NOTE — Progress Notes (Signed)
   PPD placed Left Forearm.  Pt to return 06/08/2014 at 3:45 PM for reading.  Pt tolerated intradermal injection. Clovis PuMartin, Carless Slatten L, RN

## 2014-06-08 ENCOUNTER — Encounter: Payer: Self-pay | Admitting: *Deleted

## 2014-06-08 ENCOUNTER — Ambulatory Visit (INDEPENDENT_AMBULATORY_CARE_PROVIDER_SITE_OTHER): Payer: Self-pay | Admitting: *Deleted

## 2014-06-08 DIAGNOSIS — Z7689 Persons encountering health services in other specified circumstances: Secondary | ICD-10-CM

## 2014-06-08 DIAGNOSIS — Z111 Encounter for screening for respiratory tuberculosis: Secondary | ICD-10-CM

## 2014-06-08 LAB — TB SKIN TEST
Induration: 0 mm
TB SKIN TEST: NEGATIVE

## 2014-06-25 ENCOUNTER — Encounter: Payer: Self-pay | Admitting: Family Medicine

## 2014-06-25 ENCOUNTER — Ambulatory Visit (INDEPENDENT_AMBULATORY_CARE_PROVIDER_SITE_OTHER): Payer: Self-pay | Admitting: Family Medicine

## 2014-06-25 ENCOUNTER — Other Ambulatory Visit (HOSPITAL_COMMUNITY)
Admission: RE | Admit: 2014-06-25 | Discharge: 2014-06-25 | Disposition: A | Discharge status: Home / Self Care | Payer: Self-pay | Source: Ambulatory Visit | Attending: Family Medicine | Admitting: Family Medicine

## 2014-06-25 VITALS — BP 117/78 | HR 81 | Temp 98.3°F | Wt 160.0 lb

## 2014-06-25 DIAGNOSIS — Z202 Contact with and (suspected) exposure to infections with a predominantly sexual mode of transmission: Secondary | ICD-10-CM

## 2014-06-25 DIAGNOSIS — Z113 Encounter for screening for infections with a predominantly sexual mode of transmission: Secondary | ICD-10-CM | POA: Insufficient documentation

## 2014-06-25 LAB — POCT WET PREP (WET MOUNT)
Clue Cells Wet Prep Whiff POC: POSITIVE
WBC, Wet Prep HPF POC: >20

## 2014-06-25 MED ORDER — CEFTRIAXONE SODIUM 1 G IJ SOLR
250.0000 mg | Freq: Once | INTRAMUSCULAR | Status: AC
  Administered 2014-06-25: 250 mg via INTRAMUSCULAR

## 2014-06-25 MED ORDER — AZITHROMYCIN 250 MG PO TABS: 1000.0000 mg | ORAL_TABLET | Freq: Once | ORAL | Status: DC

## 2014-06-25 MED ORDER — METRONIDAZOLE 500 MG PO TABS: 2000.0000 mg | ORAL_TABLET | Freq: Once | ORAL | Status: DC

## 2014-06-25 MED ORDER — CEFTRIAXONE SODIUM 250 MG IJ SOLR: 250.0000 mg | Freq: Once | INTRAMUSCULAR | Status: DC

## 2014-06-25 MED ORDER — AZITHROMYCIN 250 MG PO TABS
1000.0000 mg | ORAL_TABLET | Freq: Once | ORAL | Status: AC
  Administered 2014-06-25: 1000 mg via ORAL

## 2014-06-25 NOTE — Patient Instructions (Signed)

## 2014-06-25 NOTE — Progress Notes (Signed)
   Subjective:    Patient ID: Ronnell FreshwaterSonya D Rossini, female    DOB: 03/06/1981, 33 y.o.   MRN: [id number]  HPI  Here with complaints of vaginal discharge.  Hx of gonorrhea and chlamydia, gonorrhea last in 2014.  Denies hx of trich.  Last pap in 2013 ASCUS, no repeat pap or colpo  Ulcer noted on exam: denies hx of HSV, reports "had an abscess and it popped"  Review of Systems  Constitutional: Negative for chills and diaphoresis.  Respiratory: Negative for cough and shortness of breath.   Cardiovascular: Negative for leg swelling.  Gastrointestinal: Negative for abdominal pain, diarrhea, constipation and anal bleeding.  Endocrine: Negative for cold intolerance and heat intolerance.  Genitourinary: Positive for vaginal discharge. Negative for dysuria, urgency, decreased urine volume and difficulty urinating.  Neurological: Negative for headaches.       Objective:   Physical Exam  Constitutional: She appears well-developed and well-nourished. No distress.  HENT:  Mouth/Throat: Mucous membranes are moist. Pharynx is normal.  Eyes: Conjunctivae and EOM are normal.  Neck: No adenopathy.  Cardiovascular: Regular rhythm and S2 normal.   Pulmonary/Chest: Effort normal.  Abdominal: She exhibits no distension. There is no tenderness.  Genitourinary:  Small amount of vaginal discharge, no CVA TTP  Neurological: She is alert.  Skin: Skin is warm. No rash noted. She is not diaphoretic. No pallor.  0.5cm ulcer noted on right medial thigh near labia          Assessment & Plan:  Lamar LaundrySonya was seen today for vaginal discharge.  Diagnoses and associated orders for this visit:  STD exposure - HIV antibody (with reflex). => advised needs repeat HIV in 3 months - Herpes Simplex Virus Culture - POCT Wet Prep [facility] [place]) - Cervicovaginal ancillary only - Wet prep, genital => trich noted  Hx of STDs, now with vaginal discharge, last with gonorrhea 2014 - cefTRIAXone (ROCEPHIN) injection 250 mg;  Inject 0.25 g (250 mg total) into the muscle once. - azithromycin (ZITHROMAX) tablet 1,000 mg; Take 4 tablets (1,000 mg total) by mouth once.  Trichomonas - metroNIDAZOLE (FLAGYL) 500 MG tablet; Take 4 tablets (2,000 mg total) by mouth once.  Hx of abnormal pap: once gets insurance will have pap done, if no insurance within 1 month patient advised to call back to be referred for free pap.  Thinks her insurance will be established in months to come.  Advised at length on need for regular STD testing, risk of HIV, HSV, G/C, and other STDs.  Encouraged to use condoms  Perry MountACOSTA,Finbar Nippert ROCIO, MD 4:35 PM

## 2014-06-26 LAB — CERVICOVAGINAL ANCILLARY ONLY
CHLAMYDIA, DNA PROBE: NEGATIVE
NEISSERIA GONORRHEA: NEGATIVE

## 2014-06-26 LAB — HIV ANTIBODY (ROUTINE TESTING W REFLEX): HIV: NONREACTIVE

## 2014-06-27 LAB — HERPES SIMPLEX VIRUS CULTURE: Organism ID, Bacteria: NOT DETECTED

## 2014-07-02 ENCOUNTER — Telehealth: Payer: Self-pay | Admitting: Family Medicine

## 2014-07-02 NOTE — Telephone Encounter (Signed)
Pt picked up medication at [facility] but believes she was given the wrong rx, the medication says she is supposed to inject the medication but according to chart, pt was supposed to receive flagyl.

## 2014-07-02 NOTE — Telephone Encounter (Signed)
Spoke with pharmacy and it looks like patient was written for flagyl and ceftriaxone.  She needs to discard the injectable medication.  I explained this to patient and she will check with pharmacy to see if they will offer a refund on medication not needed. Zimir Kittleson,CMA

## 2014-10-03 ENCOUNTER — Encounter (HOSPITAL_COMMUNITY): Payer: Self-pay

## 2014-10-03 ENCOUNTER — Inpatient Hospital Stay (HOSPITAL_COMMUNITY)
Admission: AD | Admit: 2014-10-03 | Discharge: 2014-10-03 | Disposition: A | Discharge status: Home / Self Care | Payer: Self-pay | Source: Ambulatory Visit | Attending: Obstetrics & Gynecology | Admitting: Obstetrics & Gynecology

## 2014-10-03 DIAGNOSIS — Z7189 Other specified counseling: Secondary | ICD-10-CM | POA: Insufficient documentation

## 2014-10-03 DIAGNOSIS — A5901 Trichomonal vulvovaginitis: Secondary | ICD-10-CM | POA: Insufficient documentation

## 2014-10-03 DIAGNOSIS — Z3202 Encounter for pregnancy test, result negative: Secondary | ICD-10-CM | POA: Insufficient documentation

## 2014-10-03 DIAGNOSIS — Z708 Other sex counseling: Secondary | ICD-10-CM

## 2014-10-03 HISTORY — DX: Other specified health status: Z78.9

## 2014-10-03 LAB — WET PREP, GENITAL
Clue Cells Wet Prep HPF POC: NONE SEEN
Yeast Wet Prep HPF POC: NONE SEEN

## 2014-10-03 LAB — POCT PREGNANCY, URINE: PREG TEST UR: NEGATIVE

## 2014-10-03 MED ORDER — AZITHROMYCIN 250 MG PO TABS
1000.0000 mg | ORAL_TABLET | Freq: Once | ORAL | Status: AC
  Administered 2014-10-03: 1000 mg via ORAL
  Filled 2014-10-03: qty 4

## 2014-10-03 MED ORDER — METRONIDAZOLE 500 MG PO TABS
2000.0000 mg | ORAL_TABLET | Freq: Once | ORAL | Status: AC
  Administered 2014-10-03: 2000 mg via ORAL
  Filled 2014-10-03: qty 4

## 2014-10-03 MED ORDER — CEFTRIAXONE SODIUM 250 MG IJ SOLR
250.0000 mg | Freq: Once | INTRAMUSCULAR | Status: AC
  Administered 2014-10-03: 250 mg via INTRAMUSCULAR
  Filled 2014-10-03: qty 250

## 2014-10-03 NOTE — MAU Provider Note (Signed)
History     CSN: [id number]  Arrival date and time: 10/03/14 2156   First Provider Initiated Contact with Patient 10/03/14 2255      No chief complaint on file.  HPI  Jennifer Cabrera is a 34 y.o. G2P0002 who presents today for a pregnancy test and she has a vaginal discharge. She states that her LMP was 08/20/14, and she has not taken a test at home. She started to notice a discharge a couple of days ago. She states that it has an odor, and it is yellow.    Past Medical History  Diagnosis Date  . No pertinent past medical history   . Medical history non-contributory     Past Surgical History  Procedure Laterality Date  . No past surgeries      History reviewed. No pertinent family history.  History  Substance Use Topics  . Smoking status: Never Smoker   . Smokeless tobacco: Not on file  . Alcohol Use: No    Allergies: No Known Allergies  Prescriptions prior to admission  Medication Sig Dispense Refill Last Dose  . ibuprofen (ADVIL,MOTRIN) 200 MG tablet Take 400 mg by mouth every 6 (six) hours as needed for headache.   10/03/2014 at Unknown time  . metroNIDAZOLE (FLAGYL) 500 MG tablet Take 4 tablets (2,000 mg total) by mouth once. (Patient not taking: Reported on 10/03/2014) 4 tablet 0 Completed Course at Unknown time  . potassium chloride (K-DUR) 10 MEQ tablet Take 1 tablet (10 mEq total) by mouth 2 (two) times daily. (Patient not taking: Reported on 10/03/2014) 30 tablet 0 Not Taking at Unknown time  . promethazine (PHENERGAN) 25 MG tablet Take 1 tablet (25 mg total) by mouth every 6 (six) hours as needed for nausea. (Patient not taking: Reported on 10/03/2014) 30 tablet 0 Not Taking at Unknown time    ROS Physical Exam   Blood pressure 114/76, pulse 81, temperature 98.2 F (36.8 C), temperature source Oral, resp. rate 20, height 5\' 5"  (1.651 m), weight 74.106 kg (163 lb 6 oz), last menstrual period 08/20/2014, SpO2 100 %.  Physical Exam  Nursing note and vitals  reviewed. Constitutional: She is oriented to person, place, and time. She appears well-developed and well-nourished. No distress.  Cardiovascular: Normal rate.   Respiratory: Effort normal.  GI: Soft. There is no tenderness.  Neurological: She is alert and oriented to person, place, and time.  Skin: Skin is warm and dry.  Psychiatric: She has a normal mood and affect.    MAU Course  Procedures  Results for orders placed or performed during the hospital encounter of 10/03/14 (from the past 24 hour(s))  Pregnancy, urine POC     Status: None   Collection Time: 10/03/14 10:12 PM  Result Value Ref Range   Preg Test, Ur NEGATIVE NEGATIVE  Wet prep, genital     Status: Abnormal   Collection Time: 10/03/14 10:30 PM  Result Value Ref Range   Yeast Wet Prep HPF POC NONE SEEN NONE SEEN   Trich, Wet Prep FEW (A) NONE SEEN   Clue Cells Wet Prep HPF POC NONE SEEN NONE SEEN   WBC, Wet Prep HPF POC FEW (A) NONE SEEN   Reviewed + Trich with the patient, and offered HIV testing today. She declines HIV testing, and states that has been tested within the year. She does wish to be treated for GC/CT today rather than waiting for her results. She states that it is very difficult for her to get  here, and that she does not feel she could come back if the test is +.   Assessment and Plan   1. Trichomoniasis of vagina   2. Counseling for sexually transmitted disease    DC home Treated today with 2 g flagyl, 1 g azithromycin and 250 mg rocephin Return to MAU as needed  Follow-up Information    Follow [facility] with [facility].   Why:  As needed   Contact information:   [address] [place] [address]        Tawnya Crook 10/03/2014, 10:59 PM

## 2014-10-03 NOTE — MAU Note (Signed)
Pt reports her period is late. Has not had a preg. Test.

## 2014-10-03 NOTE — Discharge Instructions (Signed)

## 2014-10-03 NOTE — MAU Note (Signed)
Pt. Came in stating period late and c/o discharge (yellowish/milky looking and odor, per pt)  Pt denies any pain.

## 2014-10-04 LAB — GC/CHLAMYDIA PROBE AMP (~~LOC~~) NOT AT ARMC
CHLAMYDIA, DNA PROBE: NEGATIVE
NEISSERIA GONORRHEA: NEGATIVE

## 2014-10-23 ENCOUNTER — Encounter (HOSPITAL_COMMUNITY): Payer: Self-pay | Admitting: *Deleted

## 2014-10-23 ENCOUNTER — Inpatient Hospital Stay (HOSPITAL_COMMUNITY)
Admission: AD | Admit: 2014-10-23 | Discharge: 2014-10-23 | Disposition: A | Discharge status: Home / Self Care | Payer: No Typology Code available for payment source | Source: Ambulatory Visit | Attending: Family Medicine | Admitting: Family Medicine

## 2014-10-23 DIAGNOSIS — N898 Other specified noninflammatory disorders of vagina: Secondary | ICD-10-CM

## 2014-10-23 HISTORY — DX: Trichomoniasis, unspecified: A59.9

## 2014-10-23 HISTORY — DX: Unspecified abnormal cytological findings in specimens from vagina: R87.629

## 2014-10-23 LAB — URINALYSIS, ROUTINE W REFLEX MICROSCOPIC
BILIRUBIN URINE: NEGATIVE
Glucose, UA: NEGATIVE mg/dL
KETONES UR: NEGATIVE mg/dL
Leukocytes, UA: NEGATIVE
NITRITE: NEGATIVE
Protein, ur: NEGATIVE mg/dL
Specific Gravity, Urine: >1.03 — ABNORMAL HIGH (ref 1.005–1.030)
UROBILINOGEN UA: 0.2 mg/dL (ref 0.0–1.0)
pH: 5.5 (ref 5.0–8.0)

## 2014-10-23 LAB — POCT PREGNANCY, URINE: Preg Test, Ur: NEGATIVE

## 2014-10-23 LAB — WET PREP, GENITAL
Clue Cells Wet Prep HPF POC: NONE SEEN
TRICH WET PREP: NONE SEEN
Yeast Wet Prep HPF POC: NONE SEEN

## 2014-10-23 LAB — URINE MICROSCOPIC-ADD ON

## 2014-10-23 NOTE — MAU Note (Signed)
Pt reports she was here on 03/01 and was treated for trich and is still having the same symptoms.

## 2014-10-23 NOTE — Discharge Instructions (Signed)

## 2014-10-23 NOTE — MAU Provider Note (Signed)
History     CSN: [id number]  Arrival date and time: 10/23/14 4098   First Provider Initiated Contact with Patient 10/23/14 2041      Chief Complaint  Patient presents with  . Vaginal Discharge   HPI  Ms. Jennifer Cabrera is a 34 y.o. G2P0002 who presents to MAU today with complaint of vaginal discharge. The patient was seen in 10/03/14 in MAU and treated for trichomonas. She denies intercourse since treatment. She states a watery, yellow discharge x 1 week. She denies vaginal bleeding, abdominal pain, fever or UTI symptoms.   OB History    Gravida Para Term Preterm AB TAB SAB Ectopic Multiple Living        Past Medical History  Diagnosis Date  . No pertinent past medical history   . Medical history non-contributory   . Trichomoniasis   . Vaginal Pap smear, abnormal     Past Surgical History  Procedure Laterality Date  . No past surgeries      No family history on file.  History  Substance Use Topics  . Smoking status: Never Smoker   . Smokeless tobacco: Never Used  . Alcohol Use: No    Allergies: No Known Allergies  No prescriptions prior to admission    Review of Systems  Constitutional: Negative for fever and malaise/fatigue.  Gastrointestinal: Negative for abdominal pain.  Genitourinary: Negative for dysuria, urgency and frequency.       + vaginal discharge Neg - vaginal bleeding   Physical Exam   Blood pressure 99/65, pulse 75, temperature 98.8 F (37.1 C), temperature source Oral, resp. rate 18, height  (1.676 m), weight 160 lb (72.576 kg), last menstrual period 10/15/2014, SpO2 100 %.  Physical Exam  Constitutional: She is oriented to person, place, and time. She appears well-developed and well-nourished. No distress.  Cardiovascular: Normal rate.   Respiratory: Effort normal.  GI: Soft. She exhibits no distension and no mass. There is no tenderness. There is no rebound and no guarding.  Genitourinary: Uterus is not enlarged  and not tender. Cervix exhibits no motion tenderness, no discharge and no friability. Right adnexum displays no mass and no tenderness. Left adnexum displays no mass and no tenderness. No bleeding in the vagina. Vaginal discharge (scant, thin, white discharge noted) found.  Neurological: She is alert and oriented to person, place, and time.  Skin: Skin is warm and dry. No erythema.  Psychiatric: She has a normal mood and affect.     Results for orders placed or performed during the hospital encounter of 10/23/14 (from the past 24 hour(s))  Urinalysis, Routine w reflex microscopic     Status: Abnormal   Collection Time: 10/23/14  8:01 PM  Result Value Ref Range   Color, Urine YELLOW YELLOW   APPearance CLEAR CLEAR   Specific Gravity, Urine >1.030 (H) 1.005 - 1.030   pH 5.5 5.0 - 8.0   Glucose, UA NEGATIVE NEGATIVE mg/dL   Hgb urine dipstick TRACE (A) NEGATIVE   Bilirubin Urine NEGATIVE NEGATIVE   Ketones, ur NEGATIVE NEGATIVE mg/dL   Protein, ur NEGATIVE NEGATIVE mg/dL   Urobilinogen, UA 0.2 0.0 - 1.0 mg/dL   Nitrite NEGATIVE NEGATIVE   Leukocytes, UA NEGATIVE NEGATIVE  Urine microscopic-add on     Status: Abnormal   Collection Time: 10/23/14  8:01 PM  Result Value Ref Range   Squamous Epithelial / LPF RARE RARE   WBC, UA 0-2 <3 WBC/hpf  RBC / HPF 3-6 <3 RBC/hpf   Bacteria, UA FEW (A) RARE  Pregnancy, urine POC     Status: None   Collection Time: 10/23/14  8:05 PM  Result Value Ref Range   Preg Test, Ur NEGATIVE NEGATIVE  Wet prep, genital     Status: Abnormal   Collection Time: 10/23/14  8:48 PM  Result Value Ref Range   Yeast Wet Prep HPF POC NONE SEEN NONE SEEN   Trich, Wet Prep NONE SEEN NONE SEEN   Clue Cells Wet Prep HPF POC NONE SEEN NONE SEEN   WBC, Wet Prep HPF POC FEW (A) NONE SEEN    MAU Course  Procedures None  MDM UPT -negative UA, wet prep today Declines GC/Chlamydia and HIV as it was negative 2 weeks ago  Assessment and Plan  A: Vaginal  discharge  P: Discharge home Patient advised to follow-up with [facility] as needed or if her condition were to change or worsen Patient may return to MAU as needed or if her condition were to change or worsen   Marny LowensteinJulie N Wenzel, PA-C  10/23/2014, 10:58 PM

## 2014-10-31 ENCOUNTER — Ambulatory Visit (INDEPENDENT_AMBULATORY_CARE_PROVIDER_SITE_OTHER): Payer: PRIVATE HEALTH INSURANCE | Admitting: Family Medicine

## 2014-10-31 ENCOUNTER — Encounter: Payer: Self-pay | Admitting: Family Medicine

## 2014-10-31 VITALS — BP 118/78 | HR 93 | Temp 98.2°F | Ht 66.0 in | Wt 160.1 lb

## 2014-10-31 DIAGNOSIS — N760 Acute vaginitis: Secondary | ICD-10-CM | POA: Diagnosis not present

## 2014-10-31 DIAGNOSIS — N898 Other specified noninflammatory disorders of vagina: Secondary | ICD-10-CM

## 2014-10-31 LAB — POCT URINALYSIS DIPSTICK
Bilirubin, UA: NEGATIVE
Blood, UA: NEGATIVE
Glucose, UA: NEGATIVE
Ketones, UA: NEGATIVE
Nitrite, UA: NEGATIVE
Protein, UA: NEGATIVE
Spec Grav, UA: >=1.03
Urobilinogen, UA: 0.2
pH, UA: 6

## 2014-10-31 LAB — POCT UA - MICROSCOPIC ONLY

## 2014-10-31 LAB — POCT WET PREP (WET MOUNT)
CLUE CELLS WET PREP WHIFF POC: NEGATIVE
WBC, Wet Prep HPF POC: >20

## 2014-10-31 MED ORDER — METRONIDAZOLE 500 MG PO TABS: 2000.0000 mg | ORAL_TABLET | Freq: Once | ORAL | Status: DC

## 2014-10-31 MED ORDER — FLUCONAZOLE 150 MG PO TABS: 150.0000 mg | ORAL_TABLET | Freq: Once | ORAL | Status: DC

## 2014-10-31 NOTE — Patient Instructions (Signed)
Fluconazole tablets What is this medicine? FLUCONAZOLE (floo KON na zole) is an antifungal medicine. It is used to treat certain kinds of fungal or yeast infections. This medicine may be used for other purposes; ask your health care provider or pharmacist if you have questions. COMMON BRAND NAME(S): Diflucan What should I tell my health care provider before I take this medicine? They need to know if you have any of these conditions: -electrolyte abnormalities -history of irregular heart beat -kidney disease -an unusual or allergic reaction to fluconazole, other azole antifungals, medicines, foods, dyes, or preservatives -pregnant or trying to get pregnant -breast-feeding How should I use this medicine? Take this medicine by mouth. Follow the directions on the prescription label. Do not take your medicine more often than directed. Talk to your pediatrician regarding the use of this medicine in children. Special care may be needed. This medicine has been used in children as young as 6 months of age. Overdosage: If you think you have taken too much of this medicine contact a poison control center or emergency room at once. NOTE: This medicine is only for you. Do not share this medicine with others. What if I miss a dose? If you miss a dose, take it as soon as you can. If it is almost time for your next dose, take only that dose. Do not take double or extra doses. What may interact with this medicine? Do not take this medicine with any of the following medications: -astemizole -certain medicines for irregular heart beat like dofetilide, dronedarone, quinidine -cisapride -erythromycin -lomitapide -other medicines that prolong the QT interval (cause an abnormal heart rhythm) -pimozide -terfenadine -thioridazine -tolvaptan -ziprasidone This medicine may also interact with the following medications: -antiviral medicines for HIV or AIDS -birth control pills -certain antibiotics like  rifabutin, rifampin -certain medicines for blood pressure like amlodipine, isradipine, felodipine, hydrochlorothiazide, losartan, nifedipine -certain medicines for cancer like cyclophosphamide, vinblastine, vincristine -certain medicines for cholesterol like atorvastatin, lovastatin, fluvastatin, simvastatin -certain medicines for depression, anxiety, or psychotic disturbances like amitriptyline, midazolam, nortriptyline, triazolam -certain medicines for diabetes like glipizide, glyburide, tolbutamide -certain medicines for pain like alfentanil, fentanyl, methadone -certain medicines for seizures like carbamazepine, phenytoin -certain medicines that treat or prevent blood clots like warfarin -halofantrine -medicines that lower your chance of fighting infection like cyclosporine, prednisone, tacrolimus -NSAIDS, medicines for pain and inflammation, like celecoxib, diclofenac, flurbiprofen, ibuprofen, meloxicam, naproxen -other medicines for fungal infections -sirolimus -theophylline -tofacitinib This list may not describe all possible interactions. Give your health care provider a list of all the medicines, herbs, non-prescription drugs, or dietary supplements you use. Also tell them if you smoke, drink alcohol, or use illegal drugs. Some items may interact with your medicine. What should I watch for while using this medicine? Visit your doctor or health care professional for regular checkups. If you are taking this medicine for a long time you may need blood work. Tell your doctor if your symptoms do not improve. Some fungal infections need many weeks or months of treatment to cure. Alcohol can increase possible damage to your liver. Avoid alcoholic drinks. If you have a vaginal infection, do not have sex until you have finished your treatment. You can wear a sanitary napkin. Do not use tampons. Wear freshly washed cotton, not synthetic, panties. What side effects may I notice from receiving this  medicine? Side effects that you should report to your doctor or health care professional as soon as possible: -allergic reactions like skin rash or itching, hives, swelling   of the lips, mouth, tongue, or throat -dark urine -feeling dizzy or faint -irregular heartbeat or chest pain -redness, blistering, peeling or loosening of the skin, including inside the mouth -trouble breathing -unusual bruising or bleeding -vomiting -yellowing of the eyes or skin Side effects that usually do not require medical attention (report to your doctor or health care professional if they continue or are bothersome): -changes in how food tastes -diarrhea -headache -stomach upset or nausea This list may not describe all possible side effects. Call your doctor for medical advice about side effects. You may report side effects to FDA at 1-800-FDA-1088. Where should I keep my medicine? Keep out of the reach of children. Store at room temperature below 30 degrees C (86 degrees F). Throw away any medicine after the expiration date. NOTE: This sheet is a summary. It may not cover all possible information. If you have questions about this medicine, talk to your doctor, pharmacist, or health care provider.  2015, Elsevier/Gold Standard. (2013-02-25 16:13:04)  

## 2014-10-31 NOTE — Addendum Note (Signed)
Addended by: Joanna PuffRSEY, Roselyn Doby S on: 10/31/2014 03:59 PM   Modules accepted: Orders

## 2014-10-31 NOTE — Progress Notes (Signed)
  Subjective:    Jennifer FreshwaterSonya D Cabrera is a 34 y.o. female who presents for ongoing vaginal d/c.  Seen in clinic about one month ago with vaginal d/c.  + wet prep for trich.  Tx with flagyl 1 gm.  However, started having worsening d/c about 2 weeks later, [facility] which [facility] repeated the wet prep and did not see a causative organism.  She continues to have the d/c currently, has not worsened, is not malodorous, denies greenish/yellow.     Review of Systems Pertinent items are noted in HPI.    Objective:    BP 118/78 mmHg  Pulse 93  Temp(Src) 98.2 F (36.8 C) (Oral)  Ht 5\' 6"  (1.676 m)  Wt 160 lb 1.6 oz (72.621 kg)  BMI 25.85 kg/m2  LMP 10/15/2014 General:   alert, cooperative and appears stated age  Abd: Soft/NT/ND, NABS Cardio: RRR  Assessment:     Vaginitis       Plan:     Wet prep, tx accordingly

## 2015-01-29 ENCOUNTER — Encounter: Payer: Self-pay | Admitting: Family Medicine

## 2015-01-29 ENCOUNTER — Other Ambulatory Visit (HOSPITAL_COMMUNITY)
Admission: RE | Admit: 2015-01-29 | Discharge: 2015-01-29 | Disposition: A | Discharge status: Home / Self Care | Payer: PRIVATE HEALTH INSURANCE | Source: Ambulatory Visit | Attending: Family Medicine | Admitting: Family Medicine

## 2015-01-29 ENCOUNTER — Ambulatory Visit (INDEPENDENT_AMBULATORY_CARE_PROVIDER_SITE_OTHER): Payer: PRIVATE HEALTH INSURANCE | Admitting: Family Medicine

## 2015-01-29 VITALS — BP 105/72 | HR 88 | Temp 98.1°F | Ht 66.0 in | Wt 166.5 lb

## 2015-01-29 DIAGNOSIS — N898 Other specified noninflammatory disorders of vagina: Secondary | ICD-10-CM | POA: Diagnosis not present

## 2015-01-29 DIAGNOSIS — Z113 Encounter for screening for infections with a predominantly sexual mode of transmission: Secondary | ICD-10-CM | POA: Diagnosis present

## 2015-01-29 DIAGNOSIS — Z01419 Encounter for gynecological examination (general) (routine) without abnormal findings: Secondary | ICD-10-CM | POA: Insufficient documentation

## 2015-01-29 DIAGNOSIS — Z1151 Encounter for screening for human papillomavirus (HPV): Secondary | ICD-10-CM | POA: Insufficient documentation

## 2015-01-29 DIAGNOSIS — R399 Unspecified symptoms and signs involving the genitourinary system: Secondary | ICD-10-CM | POA: Diagnosis not present

## 2015-01-29 LAB — POCT UA - MICROSCOPIC ONLY

## 2015-01-29 LAB — POCT WET PREP (WET MOUNT): CLUE CELLS WET PREP WHIFF POC: POSITIVE

## 2015-01-29 LAB — POCT URINALYSIS DIPSTICK
Bilirubin, UA: NEGATIVE
Glucose, UA: NEGATIVE
KETONES UA: NEGATIVE
NITRITE UA: NEGATIVE
PH UA: 6
PROTEIN UA: NEGATIVE
RBC UA: NEGATIVE
Spec Grav, UA: >=1.03
Urobilinogen, UA: 1

## 2015-01-29 MED ORDER — NITROFURANTOIN MONOHYD MACRO 100 MG PO CAPS: 100.0000 mg | ORAL_CAPSULE | Freq: Two times a day (BID) | ORAL | Status: DC

## 2015-01-29 NOTE — Patient Instructions (Signed)
We are doing labs today and I will call if anything is NOT normal. Take the macrobid twice daily for 7 days. Return in 1 week if symptoms are not significantly improved. Stay very hydrated as you are doing.  Best,  Jennifer SingletonMaria T Chidubem Chaires, MD

## 2015-01-29 NOTE — Progress Notes (Signed)
Patient ID: Jennifer Cabrera, female   DOB: 12/12/1980, 34 y.o.   MRN: [id number] Subjective:   CC: ?UTI  HPI:   Patient presents to sameday visit for possible UTI. H/o trichomonas and chlamydia. No frequency or urgency. Reports dysuria, pressure in pelvis. No fevers, chills, or low back pain. Mild discharge. Started last week. She denies vomiting, nausea, diarrhea. Reports mild vaginal discharge. No concern for STD or pregnancy per patient. She uses condoms for birth control. No dyspareunia.  Review of Systems - Per HPI.   PMH - Molluscum contagiosum, h/o chlamydia, tobacco dependence, h/o abnormal pap    Objective:  Physical Exam BP 105/72 mmHg  Pulse 88  Temp(Src) 98.1 F (36.7 C) (Oral)  Ht 5\' 6"  (1.676 m)  Wt 166 lb 8 oz (75.524 kg)  BMI 26.89 kg/m2  LMP 12/13/2014 GEN: NAD ABD: S/moderate TTP suprapubically/nondistended PULM: Normal effort GU: External genitalia normal in appearance; cervix visualized and normal with thick yellowish discharge; no CMT; uterus and adnexae normal in size and mobility  Assessment:     Jennifer FreshwaterSonya D Cabrera is a 34 y.o. female here for dysuria.    Plan:     # See problem list and after visit summary for problem-specific plans.  # Health Maintenance: Due for pap so performed today  Follow-up: Follow up in 1 week PRN if symptoms not improved.   Leona SingletonMaria T Roemello Speyer, MD [facility]

## 2015-01-30 ENCOUNTER — Telehealth: Payer: Self-pay | Admitting: Family Medicine

## 2015-01-30 DIAGNOSIS — R399 Unspecified symptoms and signs involving the genitourinary system: Secondary | ICD-10-CM | POA: Insufficient documentation

## 2015-01-30 DIAGNOSIS — A599 Trichomoniasis, unspecified: Secondary | ICD-10-CM

## 2015-01-30 MED ORDER — FLUCONAZOLE 150 MG PO TABS: 150.0000 mg | ORAL_TABLET | Freq: Once | ORAL | Status: DC

## 2015-01-30 MED ORDER — METRONIDAZOLE 500 MG PO TABS: 500.0000 mg | ORAL_TABLET | Freq: Two times a day (BID) | ORAL | Status: DC

## 2015-01-30 NOTE — Telephone Encounter (Signed)
Called to notify patient that wet prep was positive for BV and trichomonas. She is surprised because she had this ~3 months ago as well and took the 2g metronidazole treatment, but states symptoms never fully cleared. She wonders if abx did not work. States she has been sexually active with same partner but has used condoms due to concern about still having mild discharge.  -Metronidazole 500mg  BID x 7 days. Repeat course and call on 10th day if symptoms still present. Warned her re: alcohol (disulfiram-type reaction) and not to use in 1st trimester if pregnant (she does not think she is). -Diflucan prescribed in case of yeast infection (given exposure to macrobid and metronidazole). -F/u in 2 weeks for test of cure given recurrent infection. At that time, get HIV testing (neg 06/2014). -Discussed partner treatment and avoiding sex until both completed treatment and completely asymptomatic. -Pt voiced understanding.  Jennifer SingletonMaria T Daegen Berrocal, MD

## 2015-01-30 NOTE — Assessment & Plan Note (Signed)
Dysuria with sx's classic for UTI, though UA relatively unremarkable/dirty with many squamous cells. H/o trichomonas and chlamydia. Pt not concerned for STD, though with this hx, would test.  -Empiric tx for UTI with macrobid given sx's -Wet prep, GC/chlamydia -Due for pap so performed today -Declines HIV/RPR today as reports it was neg 2 months ago. -Return if fevers, chills, severe pain, or other concerns. Otherwise, f/u 1 week if not improviing.

## 2015-01-31 ENCOUNTER — Telehealth: Payer: Self-pay | Admitting: Family Medicine

## 2015-01-31 LAB — CYTOLOGY - PAP

## 2015-01-31 NOTE — Telephone Encounter (Signed)
Called to notify patient pap smear (with HPV cotesting), GC and chlamydia were negative. She voiced understanding.  Leona SingletonMaria T Nayab Aten, MD

## 2015-03-18 ENCOUNTER — Other Ambulatory Visit: Payer: Self-pay | Admitting: Obstetrics and Gynecology

## 2015-03-18 DIAGNOSIS — A599 Trichomoniasis, unspecified: Secondary | ICD-10-CM

## 2015-03-18 NOTE — Telephone Encounter (Signed)
Pt calling and would like to speak to her PCP about a medication that was prescribed to her at her last visit 01/29/15. Thank you, Dorothey Baseman, ASA

## 2015-03-19 MED ORDER — METRONIDAZOLE 500 MG PO TABS: 500.0000 mg | ORAL_TABLET | Freq: Two times a day (BID) | ORAL | Status: DC

## 2015-03-19 NOTE — Telephone Encounter (Signed)
LM for patient to call back. Maleeah Crossman,CMA  

## 2015-03-19 NOTE — Telephone Encounter (Signed)
Spoke with patient and she states she was unable to finish dose of flagyl when she was originally prescribed it.  Patient had gone out of town and skipped a few doses.  When she started back on the medication it didn't help much with her symptoms.  Patient would like to know if she can get a new script called in so she can restart and finish as prescribed.  Jazmin Hartsell,CMA

## 2015-03-19 NOTE — Telephone Encounter (Signed)
Will prescribe for patient but if symptoms still persist she needs to come in and be evaluated. Please tell her to avoid alcohol and avoid taking medication if she believes she could be pregnant.

## 2015-04-14 ENCOUNTER — Emergency Department (HOSPITAL_COMMUNITY)
Admission: EM | Admit: 2015-04-14 | Discharge: 2015-04-14 | Disposition: A | Discharge status: Home / Self Care | Payer: No Typology Code available for payment source | Attending: Emergency Medicine | Admitting: Emergency Medicine

## 2015-04-14 ENCOUNTER — Encounter (HOSPITAL_COMMUNITY): Payer: Self-pay | Admitting: Emergency Medicine

## 2015-04-14 DIAGNOSIS — A5901 Trichomonal vulvovaginitis: Secondary | ICD-10-CM | POA: Insufficient documentation

## 2015-04-14 DIAGNOSIS — Z79899 Other long term (current) drug therapy: Secondary | ICD-10-CM | POA: Insufficient documentation

## 2015-04-14 DIAGNOSIS — N898 Other specified noninflammatory disorders of vagina: Secondary | ICD-10-CM | POA: Diagnosis present

## 2015-04-14 DIAGNOSIS — A599 Trichomoniasis, unspecified: Secondary | ICD-10-CM

## 2015-04-14 DIAGNOSIS — Z3202 Encounter for pregnancy test, result negative: Secondary | ICD-10-CM | POA: Insufficient documentation

## 2015-04-14 LAB — URINE MICROSCOPIC-ADD ON

## 2015-04-14 LAB — URINALYSIS, ROUTINE W REFLEX MICROSCOPIC
BILIRUBIN URINE: NEGATIVE
Glucose, UA: NEGATIVE mg/dL
Hgb urine dipstick: NEGATIVE
KETONES UR: NEGATIVE mg/dL
NITRITE: NEGATIVE
PROTEIN: NEGATIVE mg/dL
SPECIFIC GRAVITY, URINE: 1.027 (ref 1.005–1.030)
Urobilinogen, UA: 1 mg/dL (ref 0.0–1.0)
pH: 6 (ref 5.0–8.0)

## 2015-04-14 LAB — WET PREP, GENITAL
Clue Cells Wet Prep HPF POC: NONE SEEN
Yeast Wet Prep HPF POC: NONE SEEN

## 2015-04-14 LAB — POC URINE PREG, ED: Preg Test, Ur: NEGATIVE

## 2015-04-14 MED ORDER — TINIDAZOLE 500 MG PO TABS: 2.0000 g | ORAL_TABLET | Freq: Every day | ORAL | Status: DC

## 2015-04-14 NOTE — ED Provider Notes (Signed)
CSN: [id number]     Arrival date & time 04/14/15  1819 History   First MD Initiated Contact with Patient 04/14/15 1850     Chief Complaint  Patient presents with  . Vaginal Discharge     (Consider location/radiation/quality/duration/timing/severity/associated sxs/prior Treatment) Patient is a 34 y.o. female presenting with vaginal discharge. The history is provided by the patient.  Vaginal Discharge Quality:  Watery and yellow Severity:  Moderate Onset quality:  Gradual Duration:  2 weeks Timing:  Constant Progression:  Worsening Chronicity:  Recurrent Relieved by:  None tried Worsened by:  Nothing tried  Jennifer Cabrera is a 33 y.o. G2P2 presents to the ED with vaginal d/c. She reports having gone to her GYN for her pap smear 2 months ago and at that time told she had trichomonas. She and her partner of several years were both treated. She reports no sexual intercourse since the treatment but continues to have a frothy d/c with itching. She denies any other problems.   Past Medical History  Diagnosis Date  . No pertinent past medical history   . Medical history non-contributory   . Trichomoniasis   . Vaginal Pap smear, abnormal    Past Surgical History  Procedure Laterality Date  . No past surgeries     No family history on file. Social History  Substance Use Topics  . Smoking status: Never Smoker   . Smokeless tobacco: Never Used  . Alcohol Use: No   OB History    Gravida Para Term Preterm AB TAB SAB Ectopic Multiple Living   2 2 0 0 0 0 0 0 0 2      Review of Systems  Genitourinary: Positive for vaginal discharge.  all other systems negataive    Allergies  Review of patient's allergies indicates no known allergies.  Home Medications   Prior to Admission medications   Medication Sig Start Date End Date Taking? Authorizing Provider  fluconazole (DIFLUCAN) 150 MG tablet Take 1 tablet (150 mg total) by mouth once. If still itching 3 days later, take 2nd dose  01/30/15   Leona Singleton, MD  metroNIDAZOLE (FLAGYL) 500 MG tablet Take 1 tablet (500 mg total) by mouth 2 (two) times daily. For 7 days. 03/19/15   Pincus Large, DO  nitrofurantoin, macrocrystal-monohydrate, (MACROBID) 100 MG capsule Take 1 capsule (100 mg total) by mouth 2 (two) times daily. 01/29/15   Leona Singleton, MD  tinidazole (TINDAMAX) 500 MG tablet Take 4 tablets (2,000 mg total) by mouth daily with breakfast. 04/14/15   Hope Orlene Och, NP   BP 111/75 mmHg  Pulse 98  Temp(Src) 98.3 F (36.8 C) (Oral)  Resp 20  Ht 5\' 6"  (1.676 m)  Wt 165 lb 11.2 oz (75.161 kg)  BMI 26.76 kg/m2  SpO2 100%  LMP 04/03/2015 (Approximate) Physical Exam  Constitutional: She is oriented to person, place, and time. She appears well-developed and well-nourished. No distress.  HENT:  Head: Normocephalic.  Eyes: Conjunctivae and EOM are normal.  Neck: Normal range of motion. Neck supple.  Cardiovascular: Normal rate and regular rhythm.   Pulmonary/Chest: Effort normal and breath sounds normal.  Abdominal: Soft. Bowel sounds are normal. There is no tenderness.  Genitourinary:  External genitalia without lesions, frothy d/c vaginal vault. No CMT,no adnexal tenderness, uterus without palpable enlargement.   Musculoskeletal: Normal range of motion.  Neurological: She is alert and oriented to person, place, and time. No cranial nerve deficit.  Skin: Skin is warm and dry.  Psychiatric: She has a normal mood and affect. Her behavior is normal.  Nursing note and vitals reviewed.   ED Course  Procedures (including critical care time) Labs Review Results for orders placed or performed during the hospital encounter of 04/14/15 (from the past 24 hour(s))  Urinalysis, Routine w reflex microscopic (not at [facility])     Status: Abnormal   Collection Time: 04/14/15  6:59 PM  Result Value Ref Range   Color, Urine YELLOW YELLOW   APPearance CLEAR CLEAR   Specific Gravity, Urine 1.027 1.005 - 1.030   pH  6.0 5.0 - 8.0   Glucose, UA NEGATIVE NEGATIVE mg/dL   Hgb urine dipstick NEGATIVE NEGATIVE   Bilirubin Urine NEGATIVE NEGATIVE   Ketones, ur NEGATIVE NEGATIVE mg/dL   Protein, ur NEGATIVE NEGATIVE mg/dL   Urobilinogen, UA 1.0 0.0 - 1.0 mg/dL   Nitrite NEGATIVE NEGATIVE   Leukocytes, UA SMALL (A) NEGATIVE  Urine microscopic-add on     Status: Abnormal   Collection Time: 04/14/15  6:59 PM  Result Value Ref Range   Squamous Epithelial / LPF FEW (A) RARE   WBC, UA 3-6 <3 WBC/hpf   RBC / HPF 0-2 <3 RBC/hpf   Urine-Other MUCOUS PRESENT   POC urine preg, ED (not at [facility])     Status: None   Collection Time: 04/14/15  7:09 PM  Result Value Ref Range   Preg Test, Ur NEGATIVE NEGATIVE  Wet prep, genital     Status: Abnormal   Collection Time: 04/14/15  7:40 PM  Result Value Ref Range   Yeast Wet Prep HPF POC NONE SEEN NONE SEEN   Trich, Wet Prep FEW (A) NONE SEEN   Clue Cells Wet Prep HPF POC NONE SEEN NONE SEEN   WBC, Wet Prep HPF POC TOO NUMEROUS TO COUNT (A) NONE SEEN     MDM  34 y.o. female with recurrent trichomonas despite treatment with Flagyl. Will treat with tindamax 2 grams PO for one dose. She will follow up with her PCP if symptoms persist.  Stable for d/c without pain or other problems. GC, Chlamydia cultures pending.  Discussed with the patient and all questioned fully answered.    Final diagnoses:  Trichomonas infection       Janne Napoleon, NP 04/14/15 2109  Alvira Monday, MD 04/18/15 1215

## 2015-04-14 NOTE — Discharge Instructions (Signed)

## 2015-04-14 NOTE — ED Notes (Signed)
Pt denies urinary symptoms, states she had a pap smear a month ago, was treated for trich, states it went away but came back. C/o frequent yellow greenish discharge. Denies pain.

## 2015-04-15 LAB — GC/CHLAMYDIA PROBE AMP (~~LOC~~) NOT AT ARMC
CHLAMYDIA, DNA PROBE: NEGATIVE
NEISSERIA GONORRHEA: NEGATIVE

## 2015-04-15 LAB — HIV ANTIBODY (ROUTINE TESTING W REFLEX): HIV Screen 4th Generation wRfx: NONREACTIVE

## 2015-04-16 LAB — RPR: RPR Ser Ql: NONREACTIVE

## 2015-07-03 ENCOUNTER — Ambulatory Visit: Payer: PRIVATE HEALTH INSURANCE

## 2015-07-09 ENCOUNTER — Ambulatory Visit (INDEPENDENT_AMBULATORY_CARE_PROVIDER_SITE_OTHER): Payer: Self-pay | Admitting: *Deleted

## 2015-07-09 DIAGNOSIS — Z111 Encounter for screening for respiratory tuberculosis: Secondary | ICD-10-CM

## 2015-07-09 NOTE — Progress Notes (Signed)
    Patient presents for PPD placement for work Denies previous positive TB test  Denies known exposure to TB   Tuberculin skin test applied to left ventral forearm.  Patient aware that she needs to return in 48 hours for PPD reading. Appt given. DUCATTE, LAURENZE L, RN    

## 2015-07-11 ENCOUNTER — Ambulatory Visit (INDEPENDENT_AMBULATORY_CARE_PROVIDER_SITE_OTHER): Payer: No Typology Code available for payment source | Admitting: *Deleted

## 2015-07-11 DIAGNOSIS — Z111 Encounter for screening for respiratory tuberculosis: Secondary | ICD-10-CM

## 2015-07-11 DIAGNOSIS — Z7689 Persons encountering health services in other specified circumstances: Secondary | ICD-10-CM

## 2015-07-11 LAB — TB SKIN TEST
Induration: 0 mm
TB SKIN TEST: NEGATIVE

## 2015-07-11 NOTE — Progress Notes (Signed)
    PPD Reading Note PPD read and results entered in Epic Result: 0 mm induration. Interpretation: negative Allergic reaction: no Note given for work Maleeyah Mccaughey, Nickola MajorLAURENZE L, RN

## 2015-09-20 ENCOUNTER — Telehealth: Payer: Self-pay | Admitting: Obstetrics and Gynecology

## 2015-09-20 NOTE — Telephone Encounter (Signed)
Left message asking pt if she has received her flu shot

## 2016-03-15 ENCOUNTER — Encounter (HOSPITAL_COMMUNITY): Payer: Self-pay | Admitting: *Deleted

## 2016-03-15 ENCOUNTER — Inpatient Hospital Stay (HOSPITAL_COMMUNITY)
Admission: AD | Admit: 2016-03-15 | Discharge: 2016-03-15 | Disposition: A | Discharge status: Home / Self Care | Payer: Medicaid Other | Source: Ambulatory Visit | Attending: Obstetrics & Gynecology | Admitting: Obstetrics & Gynecology

## 2016-03-15 DIAGNOSIS — A599 Trichomoniasis, unspecified: Secondary | ICD-10-CM | POA: Diagnosis not present

## 2016-03-15 DIAGNOSIS — R109 Unspecified abdominal pain: Secondary | ICD-10-CM | POA: Diagnosis present

## 2016-03-15 DIAGNOSIS — R300 Dysuria: Secondary | ICD-10-CM | POA: Diagnosis present

## 2016-03-15 DIAGNOSIS — N739 Female pelvic inflammatory disease, unspecified: Secondary | ICD-10-CM | POA: Diagnosis not present

## 2016-03-15 DIAGNOSIS — A5901 Trichomonal vulvovaginitis: Secondary | ICD-10-CM | POA: Diagnosis not present

## 2016-03-15 LAB — URINALYSIS, ROUTINE W REFLEX MICROSCOPIC
BILIRUBIN URINE: NEGATIVE
Glucose, UA: NEGATIVE mg/dL
Ketones, ur: NEGATIVE mg/dL
Nitrite: NEGATIVE
PH: 6 (ref 5.0–8.0)
PROTEIN: NEGATIVE mg/dL
Specific Gravity, Urine: >1.03 — ABNORMAL HIGH (ref 1.005–1.030)

## 2016-03-15 LAB — WET PREP, GENITAL
CLUE CELLS WET PREP: NONE SEEN
SPERM: NONE SEEN
Yeast Wet Prep HPF POC: NONE SEEN

## 2016-03-15 LAB — URINE MICROSCOPIC-ADD ON

## 2016-03-15 LAB — POCT PREGNANCY, URINE: Preg Test, Ur: NEGATIVE

## 2016-03-15 MED ORDER — METRONIDAZOLE 500 MG PO TABS
2000.0000 mg | ORAL_TABLET | Freq: Once | ORAL | Status: AC
  Administered 2016-03-15: 2000 mg via ORAL
  Filled 2016-03-15: qty 4

## 2016-03-15 MED ORDER — DOXYCYCLINE HYCLATE 100 MG PO CAPS: 100.0000 mg | ORAL_CAPSULE | Freq: Two times a day (BID) | ORAL | 0 refills | Status: DC

## 2016-03-15 MED ORDER — CEFTRIAXONE SODIUM 250 MG IJ SOLR
250.0000 mg | Freq: Once | INTRAMUSCULAR | Status: AC
  Administered 2016-03-15: 250 mg via INTRAMUSCULAR
  Filled 2016-03-15: qty 250

## 2016-03-15 NOTE — MAU Note (Signed)
Pain in LLQ, started about a wk ago. Comes and goes.  Also stings a little when she urines, started 3 days ago.frequency and urgency.

## 2016-03-15 NOTE — MAU Provider Note (Signed)
History     CSN: [id number]  Arrival date and time: 03/15/16 4132   First Provider Initiated Contact with Patient 03/15/16 [id number]      Chief Complaint  Patient presents with  . Abdominal Pain  . Dysuria   HPI   [name] is a 35 y.o. non pregnant female, G2P0002 here with vaginal discharge and abdominal pain. She describes the discharge as white/yellow discharge that is runny. The discharge started 3 days ago. The discharge has an odor.  She has not tried anything over the counter. No new sexual partners. + pain with intercourse.   She see's [facility] for GYN care. She has not called them to inform them of this problem " I work 3 jobs and can come to emergency room if I need to for my problems".   OB History    Gravida Para Term Preterm AB Living   2 2 0 0 0 2   SAB TAB Ectopic Multiple Live Births   0 0 0 0        Past Medical History:  Diagnosis Date  . Medical history non-contributory   . No pertinent past medical history   . Trichomoniasis   . Vaginal Pap smear, abnormal     Past Surgical History:  Procedure Laterality Date  . NO PAST SURGERIES      No family history on file.  Social History  Substance Use Topics  . Smoking status: Never Smoker  . Smokeless tobacco: Never Used  . Alcohol use No    Allergies: No Known Allergies  Prescriptions Prior to Admission  Medication Sig Dispense Refill Last Dose  . fluconazole (DIFLUCAN) 150 MG tablet Take 1 tablet (150 mg total) by mouth once. If still itching 3 days later, take 2nd dose 2 tablet 0   . metroNIDAZOLE (FLAGYL) 500 MG tablet Take 1 tablet (500 mg total) by mouth 2 (two) times daily. For 7 days. 14 tablet 0   . nitrofurantoin, macrocrystal-monohydrate, (MACROBID) 100 MG capsule Take 1 capsule (100 mg total) by mouth 2 (two) times daily. 14 capsule 0   . tinidazole (TINDAMAX) 500 MG tablet Take 4 tablets (2,000 mg total) by mouth daily with breakfast. 4 tablet 0    Results for  orders placed or performed during the hospital encounter of 03/15/16 (from the past 48 hour(s))  Urinalysis, Routine w reflex microscopic (not at [facility])     Status: Abnormal   Collection Time: 03/15/16  8:28 AM  Result Value Ref Range   Color, Urine YELLOW YELLOW   APPearance HAZY (A) CLEAR   Specific Gravity, Urine >1.030 (H) 1.005 - 1.030   pH 6.0 5.0 - 8.0   Glucose, UA NEGATIVE NEGATIVE mg/dL   Hgb urine dipstick TRACE (A) NEGATIVE   Bilirubin Urine NEGATIVE NEGATIVE   Ketones, ur NEGATIVE NEGATIVE mg/dL   Protein, ur NEGATIVE NEGATIVE mg/dL   Nitrite NEGATIVE NEGATIVE   Leukocytes, UA LARGE (A) NEGATIVE  Urine microscopic-add on     Status: Abnormal   Collection Time: 03/15/16  8:28 AM  Result Value Ref Range   Squamous Epithelial / LPF 6-30 (A) NONE SEEN   WBC, UA 6-30 0 - 5 WBC/hpf   RBC / HPF 0-5 0 - 5 RBC/hpf   Bacteria, UA FEW (A) NONE SEEN   Urine-Other MUCOUS PRESENT   Pregnancy, urine POC     Status: None   Collection Time: 03/15/16  8:35 AM  Result Value Ref Range  Preg Test, Ur NEGATIVE NEGATIVE    Comment:        THE SENSITIVITY OF THIS METHODOLOGY IS >24 mIU/mL   Wet prep, genital     Status: Abnormal   Collection Time: 03/15/16  9:20 AM  Result Value Ref Range   Yeast Wet Prep HPF POC NONE SEEN NONE SEEN   Trich, Wet Prep PRESENT (A) NONE SEEN   Clue Cells Wet Prep HPF POC NONE SEEN NONE SEEN   WBC, Wet Prep HPF POC MANY (A) NONE SEEN   Sperm NONE SEEN     Review of Systems  Constitutional: Negative for chills and fever.  Gastrointestinal: Positive for abdominal pain (Bilateral, lower abdominal cramping ).  Genitourinary: Positive for dysuria, frequency and urgency.   Physical Exam   Blood pressure 105/70, pulse 99, temperature 98.1 F (36.7 C), temperature source Oral, resp. rate 16, height 5\' 5"  (1.651 m), weight 181 lb (82.1 kg), last menstrual period 02/07/2016.  Physical Exam  Constitutional: She is oriented to person, place, and time. She  appears well-developed and well-nourished. No distress.  HENT:  Head: Normocephalic.  Eyes: Pupils are equal, round, and reactive to light.  GI: Soft. She exhibits no distension. There is no tenderness. There is no rebound.  Genitourinary:  Genitourinary Comments: Speculum exam: Vagina - Moderate amount of creamy, yellow discharge, + odor Cervix - + contact bleeding Bimanual exam: Cervix closed, + CMT  Uterus non tender, normal size Adnexa non tender, no masses bilaterally GC/Chlam, wet prep done Chaperone present for exam.  Musculoskeletal: Normal range of motion.  Neurological: She is alert and oriented to person, place, and time.  Skin: Skin is warm. She is not diaphoretic.  Psychiatric: Her affect is angry. She is agitated.    MAU Course  Procedures  MDM  Wet prep GC UA Urine culture Rocephin 250 mg IM Flagyl 2 grams PO    Assessment and Plan   A:  1. Female pelvic inflammatory disease   2. Trichomonas infection     P:  Discharge home in stable condition Rx: Doxycycline Condoms always Partner needs treatment and testing. Patient not interested in talking about partner therapy/treatment Return to MAU for emergencies only Follow up with GYN practice for GYN concerns    Duane LopeJennifer I Rasch, NP 03/15/2016 9:26 AM

## 2016-03-15 NOTE — Discharge Instructions (Signed)
Pelvic Inflammatory Disease °Pelvic inflammatory disease (PID) refers to an infection in some or all of the female organs. The infection can be in the uterus, ovaries, fallopian tubes, or the surrounding tissues in the pelvis. PID can cause abdominal or pelvic pain that comes on suddenly (acute pelvic pain). PID is a serious infection because it can lead to lasting (chronic) pelvic pain or the inability to have children (infertility). °CAUSES °This condition is most often caused by an infection that is spread during sexual contact. However, the infection can also be caused by the normal bacteria that are found in the vaginal tissues if these bacteria travel upward into the reproductive organs. PID can also occur following: °· The birth of a baby. °· A miscarriage. °· An abortion. °· Major pelvic surgery. °· The use of an intrauterine device (IUD). °· A sexual assault. °RISK FACTORS °This condition is more likely to develop in women who: °· Are younger than 34 years of age. °· Are sexually active at a young age. °· Use nonbarrier contraception. °· Have multiple sexual partners. °· Have sex with someone who has symptoms of an STD (sexually transmitted disease). °· Use oral contraception. °At times, certain behaviors can also increase the possibility of getting PID, such as: °· Using a vaginal douche. °· Having an IUD in place. °SYMPTOMS °Symptoms of this condition include: °· Abdominal or pelvic pain. °· Fever. °· Chills. °· Abnormal vaginal discharge. °· Abnormal uterine bleeding. °· Unusual pain shortly after the end of a menstrual period. °· Painful urination. °· Pain with sexual intercourse. °· Nausea and vomiting. °DIAGNOSIS °To diagnose this condition, your health care provider will do a physical exam and take your medical history. A pelvic exam typically reveals great tenderness in the uterus and the surrounding pelvic tissues. You may also have tests, such as: °· Lab tests, including a pregnancy test, blood  tests, and urine test. °· Culture tests of the vagina and cervix to check for an STD. °· Ultrasound. °· A laparoscopic procedure to look inside the pelvis. °· Examining vaginal secretions under a microscope. °TREATMENT °Treatment for this condition may involve one or more approaches. °· Antibiotic medicines may be prescribed to be taken by mouth. °· Sexual partners may need to be treated if the infection is caused by an STD. °· For more severe cases, hospitalization may be needed to give antibiotics directly into a vein through an IV tube. °· Surgery may be needed if other treatments do not help, but this is rare. °It may take weeks until you are completely well. If you are diagnosed with PID, you should also be checked for human immunodeficiency virus (HIV). Your health care provider may test you for infection again 3 months after treatment. You should not have unprotected sex. °HOME CARE INSTRUCTIONS °· Take over-the-counter and prescription medicines only as told by your health care provider. °· If you were prescribed an antibiotic medicine, take it as told by your health care provider. Do not stop taking the antibiotic even if you start to feel better. °· Do not have sexual intercourse until treatment is completed or as told by your health care provider. If PID is confirmed, your recent sexual partners will need treatment, especially if you had unprotected sex. °· Keep all follow-up visits as told by your health care provider. This is important. °SEEK MEDICAL CARE IF: °· You have increased or abnormal vaginal discharge. °· Your pain does not improve. °· You vomit. °· You have a fever. °· You   cannot tolerate your medicines. °· Your partner has an STD. °· You have pain when you urinate. °SEEK IMMEDIATE MEDICAL CARE IF: °· You have increased abdominal or pelvic pain. °· You have chills. °· Your symptoms are not better in 72 hours even with treatment. °  °This information is not intended to replace advice given to  you by your health care provider. Make sure you discuss any questions you have with your health care provider. °  °Document Released: 07/20/2005 Document Revised: 04/10/2015 Document Reviewed: 08/27/2014 °Elsevier Interactive Patient Education ©2016 [employer] Inc[employer] °Trichomoniasis °Trichomoniasis is an infection caused by an organism called Trichomonas. The infection can affect both women and men. In women, the outer female genitalia and the vagina are affected. In men, the penis is mainly affected, but the prostate and other reproductive organs can also be involved. Trichomoniasis is a sexually transmitted infection (STI) and is most often passed to another person through sexual contact.  °RISK FACTORS °· Having unprotected sexual intercourse. °· Having sexual intercourse with an infected partner. °SIGNS AND SYMPTOMS  °Symptoms of trichomoniasis in women include: °· Abnormal gray-green frothy vaginal discharge. °· Itching and irritation of the vagina. °· Itching and irritation of the area outside the vagina. °Symptoms of trichomoniasis in men include:  °· Penile discharge with or without pain. °· Pain during urination. This results from inflammation of the urethra. °DIAGNOSIS  °Trichomoniasis may be found during a Pap test or physical exam. Your health care provider may use one of the following methods to help diagnose this infection: °· Testing the pH of the vagina with a test tape. °· Using a vaginal swab test that checks for the Trichomonas organism. A test is available that provides results within a few minutes. °· Examining a urine sample. °· Testing vaginal secretions. °Your health care provider may test you for other STIs, including HIV. °TREATMENT  °· You may be given medicine to fight the infection. Women should inform their health care provider if they could be or are pregnant. Some medicines used to treat the infection should not be taken during pregnancy. °· Your health care provider may recommend  over-the-counter medicines or creams to decrease itching or irritation. °· Your sexual partner will need to be treated if infected. °· Your health care provider may test you for infection again 3 months after treatment. °HOME CARE INSTRUCTIONS  °· Take medicines only as directed by your health care provider. °· Take over-the-counter medicine for itching or irritation as directed by your health care provider. °· Do not have sexual intercourse while you have the infection. °· Women should not douche or wear tampons while they have the infection. °· Discuss your infection with your partner. Your partner may have gotten the infection from you, or you may have gotten it from your partner. °· Have your sex partner get examined and treated if necessary. °· Practice safe, informed, and protected sex. °· See your health care provider for other STI testing. °SEEK MEDICAL CARE IF:  °· You still have symptoms after you finish your medicine. °· You develop abdominal pain. °· You have pain when you urinate. °· You have bleeding after sexual intercourse. °· You develop a rash. °· Your medicine makes you sick or makes you throw up (vomit). °MAKE SURE YOU: °· Understand these instructions. °· Will watch your condition. °· Will get help right away if you are not doing well or get worse. °  °This information is not intended to replace advice given to you   by your health care provider. Make sure you discuss any questions you have with your health care provider. °  °Document Released: 01/13/2001 Document Revised: 08/10/2014 Document Reviewed: 05/01/2013 °Elsevier Interactive Patient Education ©2016 Elsevier Inc. ° °

## 2016-03-16 LAB — URINE CULTURE: Special Requests: NORMAL

## 2016-03-16 LAB — GC/CHLAMYDIA PROBE AMP (~~LOC~~) NOT AT ARMC
Chlamydia: NEGATIVE
Neisseria Gonorrhea: NEGATIVE

## 2016-03-31 ENCOUNTER — Other Ambulatory Visit (HOSPITAL_COMMUNITY)
Admission: RE | Admit: 2016-03-31 | Discharge: 2016-03-31 | Disposition: A | Discharge status: Home / Self Care | Payer: Self-pay | Source: Ambulatory Visit | Attending: Family Medicine | Admitting: Family Medicine

## 2016-03-31 ENCOUNTER — Ambulatory Visit (INDEPENDENT_AMBULATORY_CARE_PROVIDER_SITE_OTHER): Payer: Self-pay | Admitting: Internal Medicine

## 2016-03-31 ENCOUNTER — Telehealth: Payer: Self-pay | Admitting: *Deleted

## 2016-03-31 ENCOUNTER — Encounter: Payer: Self-pay | Admitting: Internal Medicine

## 2016-03-31 VITALS — BP 102/75 | HR 81 | Temp 98.1°F | Wt 181.6 lb

## 2016-03-31 DIAGNOSIS — A5901 Trichomonal vulvovaginitis: Secondary | ICD-10-CM | POA: Insufficient documentation

## 2016-03-31 DIAGNOSIS — R300 Dysuria: Secondary | ICD-10-CM

## 2016-03-31 DIAGNOSIS — Z113 Encounter for screening for infections with a predominantly sexual mode of transmission: Secondary | ICD-10-CM | POA: Insufficient documentation

## 2016-03-31 DIAGNOSIS — Z79899 Other long term (current) drug therapy: Secondary | ICD-10-CM

## 2016-03-31 LAB — POCT URINALYSIS DIPSTICK
BILIRUBIN UA: NEGATIVE
Glucose, UA: NEGATIVE
KETONES UA: NEGATIVE
NITRITE UA: NEGATIVE
PH UA: 5.5
PROTEIN UA: NEGATIVE
Spec Grav, UA: 1.025
Urobilinogen, UA: 0.2

## 2016-03-31 LAB — POCT WET PREP (WET MOUNT): Clue Cells Wet Prep Whiff POC: NEGATIVE

## 2016-03-31 LAB — POCT UA - MICROSCOPIC ONLY: Trichomonas, UA: POSITIVE

## 2016-03-31 LAB — POCT URINE PREGNANCY: Preg Test, Ur: NEGATIVE

## 2016-03-31 MED ORDER — METRONIDAZOLE 500 MG PO TABS: 2000.0000 mg | ORAL_TABLET | Freq: Every day | ORAL | 0 refills | Status: DC

## 2016-03-31 MED ORDER — METRONIDAZOLE 500 MG PO TABS: 500.0000 mg | ORAL_TABLET | Freq: Two times a day (BID) | ORAL | 0 refills | Status: DC

## 2016-03-31 MED ORDER — TINIDAZOLE 500 MG PO TABS: 1000.0000 mg | ORAL_TABLET | Freq: Two times a day (BID) | ORAL | 0 refills | Status: DC

## 2016-03-31 NOTE — Progress Notes (Signed)
   Subjective:    Jennifer Cabrera - 35 y.o. female MRN [id number]  Date of birth: 01/16/1981  HPI  Jennifer Cabrera is here for vaginal discharge.   Vaginal Discharge: Reports extensive history this past year of repeated Trichomonas infections. Was recently seen at [facility]U about 2 weeks ago and treated with Flagyl 2g. Was also given Rocephin IM and Rx for Doxycyline for PID. Reports vaginal discharge has persisted. It is yellow-green frothy discharge. She denies vaginal pruritis or burning. Denies fevers, dysuria, and abdominal pain at home. Denies being sexually active. Reports she was last sexually active about one year ago. [facility] with Flagyl for one week as well in the past.    -  reports that she has never smoked. She has never used smokeless tobacco. - Review of Systems: Per HPI. - Past Medical History: Patient Active Problem List   Diagnosis Date Noted  . Trichomonas vaginitis 03/31/2016  . UTI symptoms 01/30/2015  . ASCUS (atypical squamous cells of undetermined significance) on Pap smear 03/14/2012  . Well woman exam with routine gynecological exam 03/08/2012  . Vaginal discharge 03/08/2012  . MOLLUSCUM CONTAGIOSUM 06/01/2008  . VAGINAL DISCHARGE 04/18/2008  . TOBACCO DEPENDENCE 09/30/2006  . PAPANICOLAOU SMEAR, ABNORMAL 09/30/2006  . PAPANICOLAOU SMEAR, ABNORMAL 09/30/2006  . CHLAMYDIAL INFECTION 09/02/2006   - Medications: reviewed and updated    Objective:   Physical Exam BP 102/75   Pulse 81   Temp 98.1 F (36.7 C) (Oral)   Wt 181 lb 9.6 oz (82.4 kg)   LMP 02/07/2016   BMI 30.22 kg/m  Gen: NAD, alert, cooperative with exam, well-appearing GI: +BS, no TTP, no rebound or guarding  GU/GYN: Exam performed in the presence of a chaperone. External genitalia within normal limits.  Vaginal mucosa pink, moist, normal rugae.  Nonfriable cervix without lesions or bleeding noted on speculum exam.  Thick yellow discharge present in posterior  fornix and at cervical os. Bimanual exam revealed normal, nongravid uterus.  No cervical motion tenderness. No adnexal masses bilaterally.       Assessment & Plan:   Trichomonas vaginitis Patient with wet prep +Trichomonas. Per patient and chart review, she has an extensive history of Trichomonas over the past year. She adamantly denies being sexually active. Potential that patient is re-infecting herself if she is indeed sexually active and not having partner treated; however, also concerned for metronidazole resistance given repeated infections with reported lack of sexual activity. Patient appears to have been treated with one time doses of Flagyl, week long courses of Flagyl, and one time dose of Tinidazole. UA dipstick not concerning for infection. Upreg was negative . -Rx for Tinidazole 1000 mg BID x14 days given per UpToDate recommendations for treating metronidazole resistant trichomonas; patient called and reported medication would cost $400 -will try Flagyl 2g daily for 10 days as higher doses can apparently be effective in patients with low levels of resistance to metronidazole  -patient to return for f/u with PCP in 2 weeks, stressed importance of her having care at the same place for this infection  -GC/Chlamydia obtained     Marcy Sirenatherine Davarion Cuffee, D.O. 03/31/2016, 10:45 AM PGY-2, [facility]

## 2016-03-31 NOTE — Telephone Encounter (Signed)
Patient states rx prescribed today was $400, would like MD to call her so they discuss sending in something else. Patient wants to make sure its nothing she has already been tried on .

## 2016-03-31 NOTE — Assessment & Plan Note (Addendum)
Patient with wet prep +Trichomonas. Per patient and chart review, she has an extensive history of Trichomonas over the past year. She adamantly denies being sexually active. Potential that patient is re-infecting herself if she is indeed sexually active and not having partner treated; however, also concerned for metronidazole resistance given repeated infections with reported lack of sexual activity. Patient appears to have been treated with one time doses of Flagyl, week long courses of Flagyl, and one time dose of Tinidazole. UA dipstick not concerning for infection. Upreg was negative . -Rx for Tinidazole 1000 mg BID x14 days given per UpToDate recommendations for treating metronidazole resistant trichomonas; patient called and reported medication would cost $400 -will try Flagyl 2g daily for 10 days as higher doses can apparently be effective in patients with low levels of resistance to metronidazole  -patient to return for f/u with PCP in 2 weeks, stressed importance of her having care at the same place for this infection  -GC/Chlamydia obtained

## 2016-03-31 NOTE — Telephone Encounter (Signed)
Attempted to call patient to let her know that I have sent in Flagyl. It is for a higher dose and longer course than what she has taken previously and can still work in some cases with Flagyl resistance. That and the other medication I already prescribed are the only good options available for treatment. She should try this higher dose and follow up with PCP.   Marcy Sirenatherine Wallace, D.O. 03/31/2016, 1:32 PM PGY-2, [facility]

## 2016-03-31 NOTE — Patient Instructions (Signed)
Please take the Tinidazole twice per day for 14 days. It is very important that you make a follow up appointment with your PCP, Dr. Doroteo GlassmanPhelps, in 10-14 days for follow up.   Take Care,   Dr. Earlene PlaterWallace

## 2016-04-01 LAB — CERVICOVAGINAL ANCILLARY ONLY
Chlamydia: NEGATIVE
Neisseria Gonorrhea: NEGATIVE

## 2016-04-01 NOTE — Telephone Encounter (Signed)
Pt informed of below. Zimmerman Rumple, Lataja Newland D, CMA  

## 2016-04-09 ENCOUNTER — Telehealth: Payer: Self-pay | Admitting: *Deleted

## 2016-04-09 NOTE — Telephone Encounter (Signed)
Contacted pt to be sure she had been given these results and she stated that she had and that she had been diagnosed with trich and that it just keep comings back.  She said that she knows her body is resistant to flagyl and that this is what was called in at a different dose.  She said that she needs to have something that is going to take care of this. Routing to dr who sent message. Lamonte SakaiZimmerman Rumple, Anni Hocevar D, [place]

## 2016-04-09 NOTE — Telephone Encounter (Signed)
LVM for pt to call back to inform her of below and see about scheduling her an appointment with her PCP to discuss this further. Lamonte SakaiZimmerman Rumple, Deshauna Cayson D, [place]

## 2016-04-09 NOTE — Telephone Encounter (Signed)
Flagyl was called in at a higher dose because it has been effective for some people with resistance. The other option was too expensive. I advised patient that she needs to be seen by PCP for follow up approximately 10 days after appointment. Please call her and have her scheduled with PCP as I requested for further management.   Marcy Sirenatherine Trevian Hayashida, D.O. 04/09/2016, 11:26 AM PGY-2, [facility]

## 2016-04-09 NOTE — Telephone Encounter (Signed)
-----   Message from Arvilla Marketatherine Lauren Wallace, DO sent at 04/02/2016  4:51 PM EDT ----- Please call patient and let her know Gonorrhea/Chlamydia were negative.   Marcy Sirenatherine Wallace, D.O. 04/02/2016, 4:51 PM PGY-2, [facility]

## 2016-04-09 NOTE — Telephone Encounter (Signed)
Pt was advised and f/u appointment was made. ep

## 2016-04-30 ENCOUNTER — Ambulatory Visit (INDEPENDENT_AMBULATORY_CARE_PROVIDER_SITE_OTHER): Payer: Self-pay | Admitting: Obstetrics and Gynecology

## 2016-04-30 VITALS — BP 109/73 | HR 80 | Temp 98.3°F | Ht 65.0 in | Wt 180.0 lb

## 2016-04-30 DIAGNOSIS — A5901 Trichomonal vulvovaginitis: Secondary | ICD-10-CM

## 2016-04-30 DIAGNOSIS — N898 Other specified noninflammatory disorders of vagina: Secondary | ICD-10-CM

## 2016-04-30 LAB — POCT WET PREP (WET MOUNT)
Clue Cells Wet Prep Whiff POC: NEGATIVE
WBC, Wet Prep HPF POC: >20

## 2016-04-30 MED ORDER — METRONIDAZOLE 500 MG PO TABS: 2000.0000 mg | ORAL_TABLET | Freq: Every day | ORAL | 0 refills | Status: DC

## 2016-04-30 NOTE — Patient Instructions (Signed)
Trichomoniasis Trichomoniasis is an infection caused by an organism called Trichomonas. The infection can affect both women and men. In women, the outer female genitalia and the vagina are affected. In men, the penis is mainly affected, but the prostate and other reproductive organs can also be involved. Trichomoniasis is a sexually transmitted infection (STI) and is most often passed to another person through sexual contact.  RISK FACTORS  Having unprotected sexual intercourse.  Having sexual intercourse with an infected partner. SIGNS AND SYMPTOMS  Symptoms of trichomoniasis in women include:  Abnormal gray-green frothy vaginal discharge.  Itching and irritation of the vagina.  Itching and irritation of the area outside the vagina. Symptoms of trichomoniasis in men include:   Penile discharge with or without pain.  Pain during urination. This results from inflammation of the urethra. DIAGNOSIS  Trichomoniasis may be found during a Pap test or physical exam. Your health care provider may use one of the following methods to help diagnose this infection:  Testing the pH of the vagina with a test tape.  Using a vaginal swab test that checks for the Trichomonas organism. A test is available that provides results within a few minutes.  Examining a urine sample.  Testing vaginal secretions. Your health care provider may test you for other STIs, including HIV. TREATMENT   You may be given medicine to fight the infection. Women should inform their health care provider if they could be or are pregnant. Some medicines used to treat the infection should not be taken during pregnancy.  Your health care provider may recommend over-the-counter medicines or creams to decrease itching or irritation.  Your sexual partner will need to be treated if infected.  Your health care provider may test you for infection again 3 months after treatment. HOME CARE INSTRUCTIONS   Take medicines only as  directed by your health care provider.  Take over-the-counter medicine for itching or irritation as directed by your health care provider.  Do not have sexual intercourse while you have the infection.  Women should not douche or wear tampons while they have the infection.  Discuss your infection with your partner. Your partner may have gotten the infection from you, or you may have gotten it from your partner.  Have your sex partner get examined and treated if necessary.  Practice safe, informed, and protected sex.  See your health care provider for other STI testing. SEEK MEDICAL CARE IF:   You still have symptoms after you finish your medicine.  You develop abdominal pain.  You have pain when you urinate.  You have bleeding after sexual intercourse.  You develop a rash.  Your medicine makes you sick or makes you throw up (vomit). MAKE SURE YOU:  Understand these instructions.  Will watch your condition.  Will get help right away if you are not doing well or get worse.   This information is not intended to replace advice given to you by your health care provider. Make sure you discuss any questions you have with your health care provider.   Document Released: 01/13/2001 Document Revised: 08/10/2014 Document Reviewed: 05/01/2013 Elsevier Interactive Patient Education 2016 Elsevier Inc.  

## 2016-04-30 NOTE — Progress Notes (Signed)
     Subjective: Chief Complaint  Patient presents with  . Follow-up     HPI: Jennifer Cabrera is a 35 y.o. presenting to clinic today to discuss the following:  #VAGINAL DISCHARGE Continues to have vaginal discharge Was seen about a month ago for PID and Trich infection States she continues to get recurrent Trich that is resistant to Flagyl She was just given a course of Flagyl - high dose- for 5 days. Started to improve on 3rd day but symptoms came back. Does not think course was long enough.  Not currently sexually active  Symptoms Fever: no Dysuria: no Vaginal bleeding: no Abdomen or Pelvic pain: no Back pain: no  ROS noted in HPI.  Past Medical, Surgical, Social, and Family History Reviewed & Updated per EMR. Smoking status - Never Smoker   Objective: BP 109/73 (BP Location: Left Arm, Patient Position: Sitting, Cuff Size: Normal)   Pulse 80   Temp 98.3 F (36.8 C) (Oral)   Ht 5\' 5"  (1.651 m)   Wt 180 lb (81.6 kg)   LMP 04/20/2016   SpO2 100%   BMI 29.95 kg/m  Vitals and nursing notes reviewed  Physical Exam  Constitutional: She is well-developed, well-nourished, and in no distress.  Abdominal: Soft. Bowel sounds are normal. There is no tenderness. There is no guarding.  Genitourinary: Cervix normal and vulva normal. Thin  yellow-green and vaginal discharge found.   Wet prep- positive for Trich, many bacteria  Assessment/Plan: Please see problem based Assessment and Plan   Orders Placed This Encounter  Procedures  . POCT Wet Prep [facility]([place] [place])    Meds ordered this encounter  Medications  . metroNIDAZOLE (FLAGYL) 500 MG tablet    Sig: Take 4 tablets (2,000 mg total) by mouth daily.    Dispense:  40 tablet    Refill:  0    Caryl AdaJazma Mashonda Broski, DO 04/30/2016, 10:29 AM PGY-3, [facility]

## 2016-05-08 ENCOUNTER — Encounter: Payer: Self-pay | Admitting: Obstetrics and Gynecology

## 2016-05-08 NOTE — Assessment & Plan Note (Signed)
Extensive h/o recurrent Trich infection. Wet prep + for Trich this visit. Just completed 5 day course of Flagyl. Denies any new sexual activity with last encounter a year ago. 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 She reports having already tried Tinidazole without resolution of infection; dose not want to be treated with this. Only thing that works is high dose flagyl. Rx for Flagyl 2000mg  daily for 10 days. Counseled patient on side effects. Follow-up with PCP if symptoms not resolved after course of treatment.

## 2016-05-12 ENCOUNTER — Telehealth: Payer: Self-pay | Admitting: Obstetrics and Gynecology

## 2016-05-12 NOTE — Telephone Encounter (Signed)
Pt states she only has one day left on her flagyl and she still has symptons. 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 She would like to talk to dr phelps.

## 2016-05-13 NOTE — Telephone Encounter (Signed)
Called x2, no VM to have patient call back. Please advise patient that we can try alternative medication mentioned at last visit (tinidazole) is she calls back. Otherwise may need to refer her to a specialist at this point.

## 2016-05-14 ENCOUNTER — Other Ambulatory Visit: Payer: Self-pay | Admitting: Obstetrics and Gynecology

## 2016-05-14 DIAGNOSIS — A5901 Trichomonal vulvovaginitis: Secondary | ICD-10-CM

## 2016-05-14 MED ORDER — TINIDAZOLE 500 MG PO TABS: 2.0000 g | ORAL_TABLET | Freq: Every day | ORAL | 0 refills | Status: DC

## 2016-05-14 NOTE — Telephone Encounter (Signed)
Pt called back and said that she would like double dose of the Tinidazole called ASAP. jw °

## 2016-05-14 NOTE — Telephone Encounter (Deleted)
Pt called back and said that she would like double dose of the Tinidazole called ASAP. Myriam Jacobsonjw

## 2016-05-14 NOTE — Telephone Encounter (Signed)
Medication sent to pharmacy  

## 2016-05-14 NOTE — Telephone Encounter (Signed)
Will forward to MD to send in this alternative medication. Jazmin Hartsell,CMA

## 2016-05-15 NOTE — Telephone Encounter (Signed)
Will not refill further retreat with Flagyl at this time due to concern for medication overuse. Patient [facility] the [facility] where I have personally called who said medication would be about $50. If patient calls back we can try that option. Tried calling patient and no voicemail to leave message. Gyn referral placed. Will await gynecology input to help with treatment.

## 2016-05-15 NOTE — Telephone Encounter (Signed)
Pt states the medication is too expensive ($200), pt is paying out of pocket. Pt would like something equal to it, but less expensive. Please advise. Thanks! ep

## 2016-06-09 ENCOUNTER — Telehealth: Payer: Self-pay | Admitting: Obstetrics and Gynecology

## 2016-06-09 NOTE — Telephone Encounter (Signed)
Patient needs to make an appointment in clinic to discuss and further work-up. No medications to be prescribed until she can come in for evaluation. This is unusual for patient to have had so many courses of treatment over the alst few months with resolution. GYN consult was denied due to if she truly is treatment resistant needs possible ID/CDC referral. Will copy and past GYN referral message for future reference below:   Dear Dr. Doroteo GlassmanPhelps,    If this is truly resistant, and not just re-infection, please consider the following recommendations as per UpToDate:    Refractory cases-The most common causes of treatment failure are noncompliance and reinfection. Compliance is enhanced with single dose therapy. Reinfection is unlikely if the partner was treated concurrently and sexual intercourse was avoided until both partners completed treatment. Women who fail a single dose of metronidazole 2 grams can be treated with metronidazole 500 mg twice daily for seven days (total dose 7 grams) . Her partner(s) should be treated as well. If this regimen fails, tinidazole or metronidazole is administered at a dose of 2 grams per day for seven days (total dose 14 grams). These regimens can be effective in patients with low levels of metronidazole resistance, which was noted in 4 percent of T. vaginalis isolates of women attending STD clinics in six cities in the [place] . If both of the above regimens fail, the CDC recommends in vitro culture and drug susceptibility testing (available from the [facility] of [facility]D Prevention, telephone [phone]) and referral to a specialist. Therapeutic options include maximum tolerated doses of tinidazole (2 to 3 grams daily in divided doses) for 14 days. Considerable success with tinidazole in refractory disease has been reported, although the optimal dose has not been established. Cross resistance to tinidazole is frequent, but not inevitable. Patients with high level  metronidazole resistance are usually successfully treated by prolonged and high dose tinidazole therapy. High-dose tinidazole has a better safety profile and is better tolerated than high-dose metronidazole.   Case reports have described successful use of nimorazole, ornidazole, niridazole, furazolidone, and hamycin , whereas a trial of nitazoxanide in three women with difficult to eradicate T. vaginalis reported lack of efficacy.   Rare patients who do not have a response to nitroimidazoles have been treated with topical paromomycin (250 mg daily for two weeks) . Paromomycin is not available commercially in the [place] as a cream and has to be made by a Set designercompounding pharmacy. Little is known about this preparation, but severe local side effects (pain, mucosal ulceration) can occur . We recommend not using it. Other topical agents that have a limited (<50 percent) cure rate and therefore are not recommended by the CDC include intravaginal povidone-iodine, clotrimazole, acetic acid, gentian violet, nonoxynol-9, and potassium permanganate.    If these regimens do not work, please refer to Infectious Disease Specialist. There is no added benefit for her to be seen at this office as we will follow the recommendations above, and usually refer to ID for any resistant organisms..    Thank you for involving us in the care of this patient.     Jaynie CollinsUGONNA ANYANWU, MD, FACOG  Attending Obstetrician & Gynecologist, [facility]  Center for [facility], [facility]

## 2016-06-09 NOTE — Telephone Encounter (Signed)
Pt is calling because the medication tinidazole didn't work and her symptoms have came back. Can she try another medication that can help get rid of this. jw

## 2016-06-10 NOTE — Progress Notes (Signed)
   [facility] Phone: [phone]   Date of Visit: 06/11/2016   HPI: Vaginal Discharge:  - patient has extensive history of recurrent Trichomonas infection  - was seen on 9/28 and was prescribed high dose Flagyl 2000mg  daily x 10 days - she was also seen on 8/29 for this: treated with Flagyl 2g daily x 10 days (initially prescribed Tinidazole 1000mg  BID x 14 days but could not pick up due to costs).  - she was seen 03/15/16 in the [facility] and treated for PID and Trichomonas infection: treated with Rocephin x 1 and Flagyl 2g PO and given Doxycycline. Provider note also mentions "patient not interested in talking about her partner". - last dose was called in October for Metronidazole 160mg  daily x 2 weeks. Symptoms returned the day after she completed therapy  - reports she is not sexually active for about 1 year - no fevers, chills, no vaginal itching/burning, abdominal pain, or dysuria - yellow-green discharge  ROS: See HPI.  PMFSH:  Recurrent Trichomonas infection  PHYSICAL EXAM: BP 102/63   Pulse (!) 101   Temp 98.7 F (37.1 C) (Oral)   Ht 5\' 5"  (1.651 m)   Wt 183 lb (83 kg)   LMP 05/26/2016 (Approximate)   SpO2 98%   BMI 30.45 kg/m  GEN: NAD CV: RRR, no murmurs, rubs, or gallops PULM: CTAB, normal effort ABD: Soft, nontender, nondistended, NABS, no organomegaly SKIN: No rash or cyanosis; warm and well-perfused GU: Female genitalia: not done normal external genitalia, vulva, vagina, cervix, uterus and adnexa. White/green discharge noted. No cervical motion tenderness or adnexal tenderness on bimanual exam.  NEURO: Awake, alert, no focal deficits grossly, normal speech  Wet Prep: + trichomonas, many bacteria, many wbc, no clue cells or yeast   ASSESSMENT/PLAN:  Trichomonas vaginitis History of recurrent infection. Patient denies sexual activity for at least 1 year. Concern for resistance to Metronidazole. History not consistent with re-infection.   Was prescribed Tinidazole at one point but was not able to afford this. Will prescribe Tinidazole 2g daily for 7 days (UpToDate recommendations). Patient to call pharmacy to see if there is a prior authorization that can be completed so that this medication may be affordable.  - referral to ID (per UpToDate recommendations)   Palma HolterKanishka G Aaiden Depoy, MD PGY 2 [facility]

## 2016-06-10 NOTE — Telephone Encounter (Signed)
Patient is scheduled to see Dr. Ottie GlazierGunadasa on 06-11-16.  Will forward this note to her to make her aware. Sharmaine Bain,CMA

## 2016-06-11 ENCOUNTER — Telehealth: Payer: Self-pay | Admitting: Obstetrics and Gynecology

## 2016-06-11 ENCOUNTER — Encounter: Payer: Self-pay | Admitting: Internal Medicine

## 2016-06-11 ENCOUNTER — Ambulatory Visit (INDEPENDENT_AMBULATORY_CARE_PROVIDER_SITE_OTHER): Payer: Self-pay | Admitting: Internal Medicine

## 2016-06-11 VITALS — BP 102/63 | HR 101 | Temp 98.7°F | Ht 65.0 in | Wt 183.0 lb

## 2016-06-11 DIAGNOSIS — N898 Other specified noninflammatory disorders of vagina: Secondary | ICD-10-CM

## 2016-06-11 DIAGNOSIS — A5901 Trichomonal vulvovaginitis: Secondary | ICD-10-CM

## 2016-06-11 DIAGNOSIS — A599 Trichomoniasis, unspecified: Secondary | ICD-10-CM

## 2016-06-11 LAB — POCT WET PREP (WET MOUNT)

## 2016-06-11 MED ORDER — TINIDAZOLE 500 MG PO TABS: 2.0000 g | ORAL_TABLET | Freq: Every day | ORAL | 0 refills | Status: DC

## 2016-06-11 NOTE — Telephone Encounter (Signed)
PA form for tindamax placed in provider box for review.  Clovis PuMartin, Ofilia Rayon L, RN

## 2016-06-11 NOTE — Telephone Encounter (Signed)
Pt needs prior [facility] [address] for medication prescribed today for yeast infections. Please advise. Thanks! ep

## 2016-06-11 NOTE — Assessment & Plan Note (Addendum)
History of recurrent infection. Patient denies sexual activity for at least 1 year. Concern for resistance to Metronidazole. History not consistent with re-infection.  Was prescribed Tinidazole at one point but was not able to afford this. Will prescribe Tinidazole 2g daily for 7 days (UpToDate recommendations). Patient to call pharmacy to see if there is a prior authorization that can be completed so that this medication may be affordable.  - referral to ID (per UpToDate recommendations)

## 2016-06-11 NOTE — Patient Instructions (Addendum)
I made a referral for Infectious Disease specialist. They will call you to make an appointment.   I will send Tinidazole to the pharmacy. Take 4 tablets daily for 7 days. Please call about prior authorization.

## 2016-06-12 ENCOUNTER — Other Ambulatory Visit: Payer: Self-pay | Admitting: Internal Medicine

## 2016-06-12 MED ORDER — TINIDAZOLE 500 MG PO TABS: 2.0000 g | ORAL_TABLET | Freq: Every day | ORAL | 0 refills | Status: DC

## 2016-06-12 NOTE — Telephone Encounter (Signed)
Will route to Dr. Ottie GlazierGunadasa.  Clovis PuMartin, Tamika L, RN

## 2016-06-12 NOTE — Telephone Encounter (Signed)
Please refer to orders only note

## 2016-06-12 NOTE — Telephone Encounter (Signed)
Form completed 06/11/16 and given to Ms. Daphine DeutscherMartin

## 2016-06-12 NOTE — Progress Notes (Signed)
Received message from patient reporting she cannot afford Tinidazole. Spoke with Para MarchJeanette who called [facility] who will be able to provide her with the medication (Tinidazole) for less than $30.  I was unable to get in touch with her; phone would ring and then stop.  Please call patient and let her know that [facility] and it will cost less than $30.

## 2016-06-12 NOTE — Telephone Encounter (Signed)
Pt doesn't have insurance. She cannot afford the medicaition given yesterday.  It is $210.  Pt would like a cheaper medication called in .  Please let pt know what is being called in

## 2016-06-12 NOTE — Telephone Encounter (Signed)
Will forward to Dr. Ottie GlazierGunadasa.  Provider paged via Amion to check phone messages.   Clovis PuMartin, Leodan Bolyard L, RN

## 2016-06-12 NOTE — Telephone Encounter (Signed)
Pt called again wanting a cheaper medication called. Pt wants to get medication today, doesn't want to go through the weekend without anything. Pt has no insurance. Please advise. Thanks! ep

## 2016-06-15 MED FILL — TINIDAZOLE 500 MG TABLET: 500 | 7 days supply | Qty: 28 | Fill #0

## 2016-06-15 NOTE — Telephone Encounter (Signed)
Pt is calling again because she still has not had her medication changed to something she can afford. She would like Metronidazole called in. She didn't take this the proper way the first time so this is why she still has this issue. Please call this in today before noon, when she goes to work. She has been calling and waiting since 06/11/16. jw

## 2016-06-15 NOTE — Telephone Encounter (Signed)
Will route to provider who saw patient. I personally would not keep giving flagyl to patient due to side effects. Extensive phone note recorded from me from GYN doctor on last phone encounter (please review). She may need referral to ID.

## 2016-06-15 NOTE — Telephone Encounter (Signed)
Received message from patient reporting she cannot afford Tinidazole. Spoke with Para MarchJeanette who called [facility] who will be able to provide her with the medication (Tinidazole) for less than $30.  I was unable to get in touch with her; phone would ring and then stop.  Please call patient and let her know that this medication [facility] be at [facility] and it will cost less than $30.     Unable to reach pt to inform her- her phone again just rang and then stopped. Pt now is wanting Metronidazole called into her pharmacy.

## 2016-06-16 NOTE — Telephone Encounter (Signed)
Attempted again to call patient today but her phone would ring and then stop so I am unable to leave a voicemail. As noted in my previous phone note, I [facility] Tin[facility] as this pharmacy will be able to provide her with this medication for less than $30 (versus $200 at her pharmacy). However, I have no way of informing the patient of this. I already made a referral to ID at our last visit.   Please continue to attempt calling her or inform the front desk to report this to her when she does call since we are having difficulty reaching her.

## 2016-06-17 ENCOUNTER — Telehealth: Payer: Self-pay | Admitting: *Deleted

## 2016-06-17 NOTE — Telephone Encounter (Signed)
Attempted to call patient regarding appt for Hep C referral. No answer and no voice mail. Jennifer MolaJacqueline Cockerham

## 2016-08-30 ENCOUNTER — Encounter (HOSPITAL_COMMUNITY): Payer: Self-pay | Admitting: Emergency Medicine

## 2016-08-30 ENCOUNTER — Emergency Department (HOSPITAL_COMMUNITY)
Admission: EM | Admit: 2016-08-30 | Discharge: 2016-08-30 | Disposition: A | Discharge status: Home / Self Care | Payer: Self-pay | Attending: Emergency Medicine | Admitting: Emergency Medicine

## 2016-08-30 DIAGNOSIS — Y999 Unspecified external cause status: Secondary | ICD-10-CM | POA: Insufficient documentation

## 2016-08-30 DIAGNOSIS — Y929 Unspecified place or not applicable: Secondary | ICD-10-CM | POA: Insufficient documentation

## 2016-08-30 DIAGNOSIS — Z23 Encounter for immunization: Secondary | ICD-10-CM | POA: Insufficient documentation

## 2016-08-30 DIAGNOSIS — W268XXA Contact with other sharp object(s), not elsewhere classified, initial encounter: Secondary | ICD-10-CM | POA: Insufficient documentation

## 2016-08-30 DIAGNOSIS — S61412A Laceration without foreign body of left hand, initial encounter: Secondary | ICD-10-CM | POA: Insufficient documentation

## 2016-08-30 DIAGNOSIS — Y9389 Activity, other specified: Secondary | ICD-10-CM | POA: Insufficient documentation

## 2016-08-30 MED ORDER — LIDOCAINE-EPINEPHRINE (PF) 2 %-1:200000 IJ SOLN
10.0000 mL | Freq: Once | INTRAMUSCULAR | Status: AC
  Administered 2016-08-30: 10 mL
  Filled 2016-08-30: qty 20

## 2016-08-30 MED ORDER — TETANUS-DIPHTH-ACELL PERTUSSIS 5-2.5-18.5 LF-MCG/0.5 IM SUSP
0.5000 mL | Freq: Once | INTRAMUSCULAR | Status: AC
  Administered 2016-08-30: 0.5 mL via INTRAMUSCULAR
  Filled 2016-08-30: qty 0.5

## 2016-08-30 NOTE — Discharge Instructions (Signed)
Sutures out in 5-7 days. °

## 2016-08-30 NOTE — ED Provider Notes (Signed)
[facility]-EMER[facility]Y DEPT Provider Note   CSN: [id number] Arrival date & time: 08/30/16  1819   By signing my name below, I, Clovis Pu, attest that this documentation has been prepared under the direction and in the presence of Margarita Grizzle, MD  Electronically Signed: Clovis Pu, ED Scribe. 08/30/16. 7:28 PM.   History   Chief Complaint Chief Complaint  Patient presents with  . Laceration    The history is provided by the patient. No language interpreter was used.   HPI Comments:  Jennifer Cabrera is a 36 y.o. female who presents to the Emergency Department complaining of sudden onset, moderate laceration to the left side of her left hand s/p and incident which occurred PTA. Pt states she was washing dishes when a plate slipped and broke causing the injury. No alleviating factors noted. Pt denies numbness, any major medical problems, daily medication use, any known drug allergies and any other associated symptoms. Tetanus is not UTD. Pt is a non-smoker.   Past Medical History:  Diagnosis Date  . Medical history non-contributory   . No pertinent past medical history   . Trichomoniasis   . Vaginal Pap smear, abnormal     Patient Active Problem List   Diagnosis Date Noted  . Trichomonas vaginitis 03/31/2016  . ASCUS (atypical squamous cells of undetermined significance) on Pap smear 03/14/2012  . Well woman exam with routine gynecological exam 03/08/2012  . Vaginal discharge 03/08/2012  . TOBACCO DEPENDENCE 09/30/2006  . PAPANICOLAOU SMEAR, ABNORMAL 09/30/2006  . CHLAMYDIAL INFECTION 09/02/2006    Past Surgical History:  Procedure Laterality Date  . NO PAST SURGERIES      OB History    Gravida Para Term Preterm AB Living   2 2 0 0 0 2   SAB TAB Ectopic Multiple Live Births   0 0 0 0         Home Medications    Prior to Admission medications   Medication Sig Start Date End Date Taking? Authorizing Provider  tinidazole (TINDAMAX) 500 MG tablet Take 4 tablets (2,000  mg total) by mouth daily with breakfast. 06/12/16   Palma Holter, MD    Family History History reviewed. No pertinent family history.  Social History Social History  Substance Use Topics  . Smoking status: Never Smoker  . Smokeless tobacco: Never Used  . Alcohol use No     Allergies   Patient has no known allergies.   Review of Systems Review of Systems  Skin: Positive for wound.  Neurological: Negative for numbness.  All other systems reviewed and are negative.  Physical Exam Updated Vital Signs BP 114/73 (BP Location: Left Arm)   Pulse 89   Temp 98.5 F (36.9 C) (Oral)   Resp 20   SpO2 98%   Physical Exam  Constitutional: She is oriented to person, place, and time. She appears well-developed and well-nourished. No distress.  HENT:  Head: Normocephalic and atraumatic.  Eyes: Conjunctivae are normal.  Cardiovascular: Normal rate.   Pulmonary/Chest: Effort normal.  Abdominal: She exhibits no distension.  Musculoskeletal: Normal range of motion.  Sensation is intact. FROM.   Neurological: She is alert and oriented to person, place, and time.  Skin: Skin is warm and dry.  Laceration to the ulnar aspect of the left hand.   Psychiatric: She has a normal mood and affect.  Nursing note and vitals reviewed.  ED Treatments / Results  DIAGNOSTIC STUDIES:  Oxygen Saturation is 98% on RA, normal by my interpretation.  COORDINATION OF CARE:  6:50 PM Discussed treatment plan with pt at bedside and pt agreed to plan.  Labs (all labs ordered are listed, but only abnormal results are displayed) Labs Reviewed - No data to display  EKG  EKG Interpretation None       Radiology No results found.  Procedures .Marland Kitchen.Laceration Repair Date/Time: 08/30/2016 6:53 PM Performed by: Margarita GrizzleAY, Ixchel Duck Authorized by: Margarita GrizzleAY, Mayrani Khamis   Consent:    Consent obtained:  Verbal   Consent given by:  Patient   Risks discussed:  Infection Anesthesia (see MAR for exact  dosages):    Anesthesia method:  Local infiltration   Local anesthetic:  Lidocaine 2% WITH epi Laceration details:    Location:  Hand   Hand location: ulnar aspect of left hand.   Length (cm):  3 Repair type:    Repair type:  Simple Pre-procedure details:    Preparation:  Patient was prepped and draped in usual sterile fashion Exploration:    Wound exploration: wound explored through full range of motion and entire depth of wound probed and visualized   Treatment:    Area cleansed with:  Betadine   Amount of cleaning:  Standard   Irrigation solution:  Sterile saline   Irrigation method:  Syringe   Visualized foreign bodies/material removed: no   Skin repair:    Repair method:  Sutures   Suture size:  4-0   Suture material:  Prolene   Suture technique:  Simple interrupted   Number of sutures:  4 Approximation:    Approximation:  Close Post-procedure details:    Dressing:  Sterile dressing   Patient tolerance of procedure:  Tolerated well, no immediate complications       (including critical care time)  Medications Ordered in ED Medications - No data to display   Initial Impression / Assessment and Plan / ED Course  I have reviewed the triage vital signs and the nursing notes.  Pertinent labs & imaging results that were available during my care of the patient were reviewed by me and considered in my medical decision making (see chart for details).     Tetanus updated in ED. Laceration occurred < 12 hours prior to repair. Discussed laceration care with pt and answered questions. Pt to f-u for suture removal in 5-7 days and wound check sooner should there be signs of dehiscence or infection. Pt is hemodynamically stable with no complaints prior to dc.    Final Clinical Impressions(s) / ED Diagnoses   Final diagnoses:  Laceration of left hand without foreign body, initial encounter    New Prescriptions New Prescriptions   No medications on file  I personally  performed the services described in this documentation, which was scribed in my presence. The recorded information has been reviewed and considered.    Margarita Grizzleanielle Brooklin Rieger, MD 08/30/16 [phone]

## 2016-08-30 NOTE — ED Triage Notes (Signed)
Pt with laceration to left side of hand from plate today; bleeding controlled

## 2016-08-30 NOTE — ED Notes (Signed)
Pt and family understood dc material. NAD noted 

## 2016-09-01 ENCOUNTER — Ambulatory Visit (INDEPENDENT_AMBULATORY_CARE_PROVIDER_SITE_OTHER): Payer: Self-pay | Admitting: *Deleted

## 2016-09-01 ENCOUNTER — Ambulatory Visit: Payer: Self-pay

## 2016-09-01 DIAGNOSIS — Z111 Encounter for screening for respiratory tuberculosis: Secondary | ICD-10-CM

## 2016-09-01 NOTE — Progress Notes (Signed)
   PPD placed Left Forearm.  Pt to return 09/03/16 for reading.  Pt tolerated intradermal injection. Clovis PuMartin, Tamika L, RN

## 2016-09-03 ENCOUNTER — Encounter: Payer: Self-pay | Admitting: *Deleted

## 2016-09-03 ENCOUNTER — Ambulatory Visit (INDEPENDENT_AMBULATORY_CARE_PROVIDER_SITE_OTHER): Payer: Self-pay | Admitting: *Deleted

## 2016-09-03 DIAGNOSIS — Z111 Encounter for screening for respiratory tuberculosis: Secondary | ICD-10-CM

## 2016-09-03 LAB — TB SKIN TEST
INDURATION: 0 mm
TB Skin Test: NEGATIVE

## 2016-09-03 NOTE — Progress Notes (Signed)
   PPD Reading Note PPD read and results entered in EpicCare. Result: 0 mm induration. Interpretation: Negative If test not read within 48-72 hours of initial placement, patient advised to repeat in other arm 1-3 weeks after this test. Allergic reaction: no  Martin, Tamika L, RN  

## 2016-09-08 ENCOUNTER — Ambulatory Visit (INDEPENDENT_AMBULATORY_CARE_PROVIDER_SITE_OTHER): Payer: Self-pay | Admitting: *Deleted

## 2016-09-08 DIAGNOSIS — Z4802 Encounter for removal of sutures: Secondary | ICD-10-CM

## 2016-09-08 NOTE — Progress Notes (Signed)
   Patient in nurse clinic for laceration of left hand   suture removal.  Patient was seen in the ED on 08/30/16.  Patient is feeling well today.  Patient had 4 sutures placed.  Area tender to touch, no swelling, drainage or redness noticed.  Area clean with betadine and alcohol swabs.  Four sutures were removed without difficulty. Return precautions given.  Patient to follow up with PCP as needed.  Clovis PuMartin, Ramzey Petrovic L, RN

## 2016-12-03 ENCOUNTER — Other Ambulatory Visit (HOSPITAL_COMMUNITY)
Admission: RE | Admit: 2016-12-03 | Discharge: 2016-12-03 | Disposition: A | Discharge status: Home / Self Care | Payer: Self-pay | Source: Ambulatory Visit | Attending: Family Medicine | Admitting: Family Medicine

## 2016-12-03 ENCOUNTER — Ambulatory Visit (INDEPENDENT_AMBULATORY_CARE_PROVIDER_SITE_OTHER): Payer: Self-pay | Admitting: Family Medicine

## 2016-12-03 VITALS — BP 110/70 | HR 87 | Temp 98.1°F | Wt 181.0 lb

## 2016-12-03 DIAGNOSIS — A5901 Trichomonal vulvovaginitis: Secondary | ICD-10-CM | POA: Insufficient documentation

## 2016-12-03 DIAGNOSIS — N898 Other specified noninflammatory disorders of vagina: Secondary | ICD-10-CM

## 2016-12-03 DIAGNOSIS — B9689 Other specified bacterial agents as the cause of diseases classified elsewhere: Secondary | ICD-10-CM | POA: Insufficient documentation

## 2016-12-03 DIAGNOSIS — A599 Trichomoniasis, unspecified: Secondary | ICD-10-CM

## 2016-12-03 LAB — POCT WET PREP (WET MOUNT)
Clue Cells Wet Prep Whiff POC: NEGATIVE
WBC, Wet Prep HPF POC: >20

## 2016-12-03 MED ORDER — METRONIDAZOLE 250 MG PO TABS
2000.0000 mg | ORAL_TABLET | Freq: Once | ORAL | Status: AC
  Administered 2016-12-03: 2000 mg via ORAL

## 2016-12-03 NOTE — Assessment & Plan Note (Signed)
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 Patient endorses good compliance to prior treatment regimens and reports that her partners have all been treated. Will give 2 grams of flagyl today while in the office. Follow up in 2 weeks for test of cure. Asked the patient to abstain from sexual intercourse until that time. If still positive for trich at follow up, would consider referral to ID and/or susceptibility testing available through the CDC.

## 2016-12-03 NOTE — Progress Notes (Signed)
    Subjective:  Jennifer Cabrera is a 36 y.o. female who [facility] with a chief complaint of vaginal discharge.   HPI:  Vaginal Discharge Symptoms started a month ago. No other obvious symptoms. No burning, irritation, itching, foul odor, nausea, vomiting, fevers, or chills. She has tried home remedies that have not helped. She was diagnosed with trichomonas last year that was resistant to metronidazole and is concerned that she may have a recurrence.   ROS: Per HPI  PMH: Smoking history reviewed.   Objective:  Physical Exam: BP 110/70   Pulse 87   Temp 98.1 F (36.7 C) (Oral)   Wt 181 lb (82.1 kg)   LMP 11/19/2016 (Approximate)   SpO2 98%   BMI 30.12 kg/m   Gen: NAD, resting comfortably CV: RRR with no murmurs appreciated Pulm: NWOB, CTAB with no crackles, wheezes, or rhonchi GI: Normal bowel sounds present. Soft, Nontender, Nondistended. GU: Normal external female genitalia. Thin white discharge noted in vaginal vault without any other lesions.  MSK: no edema, cyanosis, or clubbing noted Skin: warm, dry Neuro: grossly normal, moves all extremities Psych: Normal affect and thought content  Results for orders placed or performed in visit on 12/03/16 (from the past 72 hour(s))  POCT Wet Prep Mellody Drown(Wet [place])     Status: Abnormal   Collection Time: 12/03/16  8:45 AM  Result Value Ref Range   Source Wet Prep POC VAG    WBC, Wet Prep HPF POC >20    Bacteria Wet Prep HPF POC Many (A) Few   Clue Cells Wet Prep HPF POC None None   Clue Cells Wet Prep Whiff POC Negative Whiff    Yeast Wet Prep HPF POC None    Trichomonas Wet Prep HPF POC Present (A) Absent   Assessment/Plan:  Trichomoniasis 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 Patient endorses good compliance to prior treatment regimens and reports that her partners have all been treated. Will give 2 grams of flagyl today while in the office. Follow up in 2 weeks for test of cure. Asked the patient  to abstain from sexual intercourse until that time. If still positive for trich at follow up, would consider referral to ID and/or susceptibility testing available through the CDC.   Katina Degreealeb M. Jimmey RalphParker, MD [facility] Resident PGY-3 12/03/2016 9:34 AM

## 2016-12-03 NOTE — Patient Instructions (Signed)
You have trich. You were treated today. Please discuss with your recent partners. Abstain from sexual activity for the next week.  Come back in 1-2 weeks to recheck.  Take care,  Dr Jimmey RalphParker

## 2016-12-04 ENCOUNTER — Encounter: Payer: Self-pay | Admitting: Family Medicine

## 2016-12-04 LAB — CERVICOVAGINAL ANCILLARY ONLY
CHLAMYDIA, DNA PROBE: NEGATIVE
NEISSERIA GONORRHEA: NEGATIVE
Trichomonas: POSITIVE — AB

## 2016-12-07 ENCOUNTER — Telehealth: Payer: Self-pay | Admitting: Obstetrics and Gynecology

## 2016-12-07 DIAGNOSIS — A599 Trichomoniasis, unspecified: Secondary | ICD-10-CM

## 2016-12-07 NOTE — Telephone Encounter (Signed)
Patient should come back to the clinic one week after treatment for a test of cure. If she is still positive, then she will need a referral.  Jennifer Kader M. Jennifer RalphParker, MD [facility] Resident PGY-3 12/07/2016 11:12 AM

## 2016-12-07 NOTE — Telephone Encounter (Signed)
Attempted to contact pt to inform of plan. However, pt did not answer and there was no option for VM. If pt calls back please schedule her for an apt for Thursday or Friday.

## 2016-12-07 NOTE — Telephone Encounter (Signed)
Pt states her symptons have returned. Dr mentioned during the visit, he might refer her to a specalisit. Please advise

## 2016-12-07 NOTE — Telephone Encounter (Signed)
Will forward to Dr. Jimmey RalphParker who saw patient for this last visit. Jazmin Hartsell,CMA

## 2016-12-08 NOTE — Telephone Encounter (Signed)
Pt doesn't want to come back into the office because she has to continue to take off work. She says she is having the same symptons and doesn't understand why she has to make another visit.

## 2016-12-08 NOTE — Telephone Encounter (Signed)
Will forward to MD to advise. Jazmin Hartsell,CMA  

## 2016-12-09 NOTE — Telephone Encounter (Signed)
Have tried referring to Gyn prior and patient was denied due to she will likely need to go to ID for resistant strain of Trich. Will place ID referral.

## 2016-12-09 NOTE — Telephone Encounter (Signed)
Pt can not seem to get rid of trich and is not wanting to come in for a test a cure, pt wants to be referred to gyn. Please advise.

## 2016-12-09 NOTE — Telephone Encounter (Signed)
Will you refer her to gyn?

## 2016-12-09 NOTE — Addendum Note (Signed)
Addended by: Pincus LargePHELPS, Haven Pylant Y on: 12/09/2016 02:33 PM   Modules accepted: Orders

## 2016-12-09 NOTE — Telephone Encounter (Signed)
Pt called again, pt is very upset that she has not heard from Dr. Doroteo GlassmanPhelps.Pt states she will call again tomorrow. ep

## 2016-12-10 NOTE — Telephone Encounter (Signed)
Attempted to contact pt to inform her of infectious disease referral. However, pt did not answer and no option for VM. If pt calls back please inform her she has been referred to infectious disease. If she has further questions you can send back to blue. Thanks!

## 2017-02-16 ENCOUNTER — Ambulatory Visit (INDEPENDENT_AMBULATORY_CARE_PROVIDER_SITE_OTHER): Payer: Self-pay | Admitting: Internal Medicine

## 2017-02-16 ENCOUNTER — Encounter: Payer: Self-pay | Admitting: Internal Medicine

## 2017-02-16 DIAGNOSIS — A5901 Trichomonal vulvovaginitis: Secondary | ICD-10-CM

## 2017-02-16 MED ORDER — TINIDAZOLE 500 MG PO TABS: 1500.0000 mg | ORAL_TABLET | Freq: Two times a day (BID) | ORAL | 0 refills | Status: DC

## 2017-02-16 MED FILL — TINIDAZOLE 500 MG TABS: 500 | 14 days supply | Qty: 84 | Fill #0

## 2017-02-16 NOTE — Progress Notes (Signed)
[facility]  Reason for Consult: Refractory trichomoniasis Referring Physician: Dr. 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 She denies sexual activity that could be reinfecting her. We have checked with the 2 pharmacies she has used over the past year and confirmed that she indeed did fill the prescriptions that she was given. She states that she completed that these each time. I will give her a higher dose tinidazole for 2 weeks and reassess her in 3-4 weeks. If she is persistently infected I will contact the CDC to see about submitting specimens for culture.   Plan: 1. Tinidazole 1500 mg twice a day for 14 days 2. Follow-up in 3-4 weeks   Patient Active Problem List   Diagnosis Date Noted  . Trichomonas vaginitis 03/31/2016  . ASCUS (atypical squamous cells of undetermined significance) on Pap smear 03/14/2012  . Well woman exam with routine gynecological exam 03/08/2012  . Trichomoniasis 03/08/2012  . TOBACCO DEPENDENCE 09/30/2006  . PAPANICOLAOU SMEAR, ABNORMAL 09/30/2006  . CHLAMYDIAL INFECTION 09/02/2006    Patient's Medications  New Prescriptions   TINIDAZOLE (TINDAMAX) 500 MG TABLET    Take 3 tablets (1,500 mg total) by mouth 2 (two) times daily.  Previous Medications   No medications on file  Modified Medications   No medications on file  Discontinued Medications   TINIDAZOLE (TINDAMAX) 500 MG TABLET    Take 4 tablets (2,000 mg total) by mouth daily with breakfast.    HPI: Jennifer Cabrera is a [age] y.o. female who developed vaginal discharge last fall. She was diagnosed with Trichomonas vaginitis. She was treated with metronidazole as was her female partner at the time. She stopped seeing that partner and has not been sexually active with anyone since that time. She has had problems with persistent vaginal discharge ever since. She has been treated with single dose,  directly observed metronidazole. She is also received multiple 10 day courses of oral metronidazole. She recalls that initially she got some improvement with metronidazole but then it stopped working. She was then given tinidazole 2 g daily for 7 days last November. She recalls that she felt well with no discharge for about 2 weeks then had it come back again. She was seen again this may and diagnosed with Trichomonas again. She was treated with a single dose of directly observed metronidazole without any benefit. The drainage is bad enough that she has to wear a pad every day. She does not have any itching or burning. She does not use any topical or intravaginal preparations. She is not on any medications. Otherwise she is in good health. She was treated for chlamydia in 2008 and had a clinical diagnosis of pelvic inflammatory disease that was treated last August. She has had no other sexually transmitted diseases that she knows of.  Review of Systems: Review of Systems  Constitutional: Negative for chills, diaphoresis, fever, malaise/fatigue and weight loss.  HENT: Negative for sore throat.   Respiratory: Negative for cough, sputum production and shortness of breath.   Cardiovascular: Negative for chest pain.  Gastrointestinal: Negative for abdominal pain, diarrhea, heartburn, nausea and vomiting.  Genitourinary: Negative for dysuria and frequency.       As noted in history of present illness.  Musculoskeletal: Negative for joint pain and myalgias.  Skin: Negative for rash.  Neurological: Negative for dizziness and headaches.  Psychiatric/Behavioral: Negative for depression and substance abuse. The patient  is not nervous/anxious.       Past Medical History:  Diagnosis Date  . Medical history non-contributory   . No pertinent past medical history   . Trichomoniasis   . Vaginal Pap smear, abnormal     Social History  Substance Use Topics  . Smoking status: Never Smoker  . Smokeless  tobacco: Never Used  . Alcohol use No    No family history on file. No Known Allergies  OBJECTIVE: Vitals:   02/16/17 0914  BP: 109/73  Pulse: 79  Temp: 98.7 F (37.1 C)  TempSrc: Oral  Weight: 179 lb (81.2 kg)  Height: 5\' 6"  (1.676 m)   Body mass index is 28.89 kg/m.   Physical Exam  Constitutional: She is oriented to person, place, and time.  She is alert and in no distress.  HENT:  Mouth/Throat: No oropharyngeal exudate.  Cardiovascular: Normal rate and regular rhythm.   No murmur heard. Pulmonary/Chest: Effort normal and breath sounds normal. She has no wheezes. She has no rales.  Abdominal: Soft. There is no tenderness.  Musculoskeletal: Normal range of motion. She exhibits no edema or tenderness.  Neurological: She is alert and oriented to person, place, and time.  Skin: No rash noted.  Psychiatric: Mood and affect normal.    Microbiology: No results found for this or any previous visit (from the past 240 hour(s)).  Cliffton Asters, MD [facility] [phone] pager   [phone] cell 02/16/2017, 12:30 PM

## 2017-02-16 NOTE — Assessment & Plan Note (Signed)
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 She denies sexual activity that could be reinfecting her. We have checked with the 2 pharmacies she has used over the past year and confirmed that she indeed did fill the prescriptions that she was given. She states that she completed that these each time. I will give her a higher dose tinidazole for 2 weeks and reassess her in 3-4 weeks. If she is persistently infected I will contact the CDC to see about submitting specimens for culture.

## 2017-03-23 ENCOUNTER — Ambulatory Visit (INDEPENDENT_AMBULATORY_CARE_PROVIDER_SITE_OTHER): Payer: Self-pay | Admitting: Internal Medicine

## 2017-03-23 ENCOUNTER — Encounter: Payer: Self-pay | Admitting: Internal Medicine

## 2017-03-23 DIAGNOSIS — A5901 Trichomonal vulvovaginitis: Secondary | ICD-10-CM

## 2017-03-23 NOTE — Assessment & Plan Note (Signed)
Appears that her persistent trichomoniasis has finally been cured with high-dose tinidazole. She can follow-up here as needed.

## 2017-03-23 NOTE — Progress Notes (Signed)
         [facility]  Patient Active Problem List   Diagnosis Date Noted  . Trichomonas vaginitis 03/31/2016    Priority: High  . ASCUS (atypical squamous cells of undetermined significance) on Pap smear 03/14/2012  . Well woman exam with routine gynecological exam 03/08/2012  . TOBACCO DEPENDENCE 09/30/2006  . PAPANICOLAOU SMEAR, ABNORMAL 09/30/2006  . CHLAMYDIAL INFECTION 09/02/2006    Patient's Medications  New Prescriptions   No medications on file  Previous Medications   No medications on file  Modified Medications   No medications on file  Discontinued Medications   TINIDAZOLE (TINDAMAX) 500 MG TABLET    Take 3 tablets (1,500 mg total) by mouth 2 (two) times daily.    Subjective: Jennifer Cabrera is in for her routine follow-up visit. She completed 2 weeks of high-dose tinidazole for her persistent trichomoniasis on 03/03/2017. She had some strong metallic taste in her mouth and anorexia while she was on tinidazole but this resolved promptly after stopping it. Her vaginal itching and discharge stopped 3 days after starting it and she has had no recurrence of any symptoms since finishing tinidazole in the past 3 weeks. She is very happy.  Review of Systems: Review of Systems  Constitutional: Negative for chills, diaphoresis and fever.  Gastrointestinal:       As noted in history of present illness.  Genitourinary: Negative for dysuria.       As noted in history of present illness.    Past Medical History:  Diagnosis Date  . Medical history non-contributory   . No pertinent past medical history   . Trichomoniasis   . Vaginal Pap smear, abnormal     Social History  Substance Use Topics  . Smoking status: Never Smoker  . Smokeless tobacco: Never Used  . Alcohol use No    No family history on file.  No Known Allergies  Objective: Vitals:   03/23/17 0848  BP: 102/68  Pulse: 91  Temp: 98.1 F (36.7 C)  SpO2: 100%  Weight: 174 lb  (78.9 kg)   Body mass index is 28.08 kg/m.  Physical Exam  Constitutional: She is oriented to person, place, and time.  She is smiling and in very good spirits today.  Abdominal: There is no tenderness.  Neurological: She is alert and oriented to person, place, and time.  Skin: No rash noted.  Psychiatric: Mood and affect normal.    Lab Results    Problem List Items Addressed This Visit      High   Trichomonas vaginitis    Appears that her persistent trichomoniasis has finally been cured with high-dose tinidazole. She can follow-up here as needed.          Cliffton Asters, MD [facility] [phone] pager   [phone] cell 03/23/2017, 8:57 AM

## 2018-06-27 ENCOUNTER — Ambulatory Visit: Payer: Self-pay | Admitting: *Deleted

## 2018-06-27 DIAGNOSIS — Z111 Encounter for screening for respiratory tuberculosis: Secondary | ICD-10-CM

## 2018-06-27 NOTE — Progress Notes (Signed)
Patient is here for a PPD placement.  PPD placed in Left forearm.  Patient will return 06/29/18 to have PPD read. Chenay Nesmith, Maryjo RochesterJessica Dawn, CMA

## 2018-06-29 ENCOUNTER — Ambulatory Visit: Payer: Self-pay | Admitting: *Deleted

## 2018-06-29 DIAGNOSIS — Z111 Encounter for screening for respiratory tuberculosis: Secondary | ICD-10-CM

## 2018-06-29 LAB — TB SKIN TEST
Induration: 0 mm
TB Skin Test: NEGATIVE

## 2018-06-29 NOTE — Progress Notes (Signed)
Patient is here for a PPD read.  It was placed on 06/27/18 in the left forearm @ 10:40am.    PPD RESULTS:  Result: Negative Induration: 0 mm  Letter created and given to patient for documentation purposes. Nicol Herbig, Maryjo RochesterJessica Dawn, CMA

## 2019-01-25 ENCOUNTER — Ambulatory Visit (INDEPENDENT_AMBULATORY_CARE_PROVIDER_SITE_OTHER): Payer: Self-pay | Admitting: Family Medicine

## 2019-01-25 ENCOUNTER — Other Ambulatory Visit: Payer: Self-pay

## 2019-01-25 ENCOUNTER — Encounter: Payer: Self-pay | Admitting: Family Medicine

## 2019-01-25 VITALS — BP 102/60 | HR 76 | Ht 66.0 in | Wt 195.5 lb

## 2019-01-25 DIAGNOSIS — E669 Obesity, unspecified: Secondary | ICD-10-CM

## 2019-01-25 DIAGNOSIS — M7989 Other specified soft tissue disorders: Secondary | ICD-10-CM | POA: Insufficient documentation

## 2019-01-25 LAB — POCT URINALYSIS DIP (MANUAL ENTRY)
Bilirubin, UA: NEGATIVE
Blood, UA: NEGATIVE
Glucose, UA: NEGATIVE mg/dL
Ketones, POC UA: NEGATIVE mg/dL
Leukocytes, UA: NEGATIVE
Nitrite, UA: NEGATIVE
Protein Ur, POC: NEGATIVE mg/dL
Spec Grav, UA: 1.025 (ref 1.010–1.025)
Urobilinogen, UA: 2 E.U./dL — AB
pH, UA: 7 (ref 5.0–8.0)

## 2019-01-25 NOTE — Assessment & Plan Note (Addendum)
Unclear etiology, R>L pitting edema. No inciting event or trauma. No joint laxity, pain on exam, or limitation in activity. No redness or warmth to suggest infection or clot. No rashes. U/A negative for proteinuria. Will check BMP to look for nephrotic involvement. Could be related to increased salt intake and weight gain given dependent nature and worse with standing. Recommended compression stockings, elevation, weight loss through diet and exercise, and DASH diet. Will follow up after lab results.

## 2019-01-25 NOTE — Patient Instructions (Signed)
It was great to see you!  Our plans for today:  - Use compression stockings when you know you are going to be standing for a long period of time. - Continue to walk and exercise as you have been doing.  - Try to lose some weight, this will help.  - Cut back on the amount of salt you eat and cook with.  We are checking some labs today, we will call you or send you a letter if they are abnormal.   Take care and seek immediate care sooner if you develop any concerns.   Dr. [facility]

## 2019-01-25 NOTE — Progress Notes (Signed)
  Subjective:   Patient ID: Lovenia Shuck    DOB: May 25, 1981, 38 y.o. female   MRN: [id number]  COSANDRA PLOUFFE is a 38 y.o. female with a history of tobacco use, ASCUS here for   L leg swelling - Reports R>L lower leg swelling for the past 3 weeks.   - Denies any known inciting event, trauma or injury but wonders if she may have stubbed her toe.   - wonders if caused by gaining weight recently.  - Denies any new prescription or over-the-counter medications - Has been walking a lot lately and stands on her right leg more so than her left and wonders if this is contributing.  Walking does not seem to make the swelling worse or cause any pain.  She has tried drinking lots of water to "flush it out" and thinks this has been helping some.  She does note she eats and cooks with a lot of salt - no pain, redness, fevers, sick contacts, rashes, chest pain, shortness of breath - no h/o blood clots  Review of Systems:  Per HPI.  [place], medications and smoking status reviewed.  Objective:   BP 102/60   Pulse 76   Ht 5\' 6"  (1.676 m)   Wt 195 lb 8 oz (88.7 kg)   LMP 01/08/2019   SpO2 99%   BMI 31.55 kg/m  Vitals and nursing note reviewed.  General: Overweight female, in no acute distress with non-toxic appearance CV: regular rate and rhythm without murmurs, rubs, or gallops Lungs: clear to auscultation bilaterally with normal work of breathing Abdomen: soft, non-tender, non-distended, no masses or organomegaly palpable, normoactive bowel sounds Skin: warm, dry, no rashes or lesions Extremities: warm and well perfused, normal tone.  1+ pitting edema to knee, R>L.  No redness or warmth.  Negative Homans sign bilaterally.   MSK: ROM grossly intact, strength intact, gait normal. No joint laxity to bilateral ankles or knees.  No point tenderness or pain with range of motion of bilateral feet. Neuro: Alert and oriented, speech normal. Intact sensation of bilateral feet.  Assessment & Plan:   Leg  swelling Unclear etiology, R>L pitting edema. No inciting event or trauma. No joint laxity, pain on exam, or limitation in activity. No redness or warmth to suggest infection or clot. No rashes. U/A negative for proteinuria. Will check BMP to look for nephrotic involvement. Could be related to increased salt intake and weight gain given dependent nature and worse with standing. Recommended compression stockings, elevation, weight loss through diet and exercise, and DASH diet. Will follow up after lab results.  Orders Placed This Encounter  Procedures  . Basic Metabolic Panel  . Urinalysis Dipstick  . POCT urinalysis dipstick   No orders of the defined types were placed in this encounter.  Rory Percy, DO PGY-2, [facility] 01/25/2019 11:14 AM

## 2019-01-26 LAB — BASIC METABOLIC PANEL
BUN/Creatinine Ratio: 10 (ref 9–23)
BUN: 8 mg/dL (ref 6–20)
CO2: 23 mmol/L (ref 20–29)
Calcium: 9.1 mg/dL (ref 8.7–10.2)
Chloride: 104 mmol/L (ref 96–106)
Creatinine, Ser: 0.77 mg/dL (ref 0.57–1.00)
GFR calc Af Amer: 114 mL/min/{1.73_m2} (ref >59)
GFR calc non Af Amer: 99 mL/min/{1.73_m2} (ref >59)
Glucose: 61 mg/dL — ABNORMAL LOW (ref 65–99)
Potassium: 3.8 mmol/L (ref 3.5–5.2)
Sodium: 141 mmol/L (ref 134–144)

## 2019-02-07 ENCOUNTER — Telehealth: Payer: Self-pay | Admitting: *Deleted

## 2019-02-07 ENCOUNTER — Other Ambulatory Visit: Payer: Self-pay

## 2019-02-07 ENCOUNTER — Telehealth (INDEPENDENT_AMBULATORY_CARE_PROVIDER_SITE_OTHER): Payer: Self-pay | Admitting: Family Medicine

## 2019-02-07 ENCOUNTER — Encounter: Payer: Self-pay | Admitting: Family Medicine

## 2019-02-07 DIAGNOSIS — Z20828 Contact with and (suspected) exposure to other viral communicable diseases: Secondary | ICD-10-CM

## 2019-02-07 DIAGNOSIS — Z20822 Contact with and (suspected) exposure to covid-19: Secondary | ICD-10-CM | POA: Insufficient documentation

## 2019-02-07 NOTE — Telephone Encounter (Signed)
Scheduled patient for COVID 19 test tomorrow at [facility] at 10:15 am.  Testing protocol reviewed with patient.

## 2019-02-07 NOTE — Progress Notes (Signed)
   Telemedicine Visit Patient consented to have visit conducted via telephone.  Encounter participants: Patient: Jennifer Cabrera  Provider: Wilber Oliphant, MD  Subjective  Subjective  Patient ID: MRN [id number]  Date of birth: 06-Mar-1981   PCP: Rory Percy, DO Name: Jennifer Cabrera, 38 y.o. female  CC: No chief complaint on file.  #Exposure to COVID positive person Patient reports that she was around someone on Thursday and Friday who was not feeling well.  On Thursday, patient's friend went to the ED but was not tested for COVID.  On Friday, she was still feeling sick and went back to the ED and was tested positive.  Patient reports that she was in the car with her COVID positive friend without her mask on for about 10 minutes on Friday.   Patient has felt fine over the weekend and continues to be asymptomatic.  She denies fever, cough, diarrhea, congestion.  ROS: See HPI  HISTORY Meds  Allergies: Reviewed as appropriate  Pertinent PMHx: no respiratory conditions or chronic medical conditions   Social Hx Jennifer Cabrera reports that she has never smoked. She has never used smokeless tobacco. She reports that she does not drink alcohol or use drugs.     Objective ECTIVEEND@ Objective  Observations:  Sounds well on the phone. No congestion or coughing noted.   Pertinent Labs & Imaging:  Reviewed in chart as appropriate   Assessment  Assessment & Plan  Exposure to Covid-19 Virus Patient asymptomatic at this time.  She is encouraged to isolate and wear her mask when going out in public.  She is informed of typical symptoms and onset of COVID and will call if she develops any of the symptoms.  Since she has contact without a mask to a COVID positive individual, will send patient to be tested.  Discussed with Preceptor, Dr. Owens Shark  Time spent on phone with patient: 10 minutes    Zettie Cooley, M.D. [place]  PGY -2 02/07/2019, 1:25 PM

## 2019-02-07 NOTE — Telephone Encounter (Signed)
-----   Message from Wilber Oliphant, MD sent at 02/07/2019 11:45 AM EDT ----- Regarding: Postive covid exposure COVID Drive-Up Test Referral Criteria  Patient age: 38 y.o.  Symptoms: No Symptoms  Underlying Conditions: No underlying conditions  Is the patient a first responder? 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 No

## 2019-02-07 NOTE — Assessment & Plan Note (Signed)
Patient asymptomatic at this time.  She is encouraged to isolate and wear her mask when going out in public.  She is informed of typical symptoms and onset of COVID and will call if she develops any of the symptoms.  Since she has contact without a mask to a COVID positive individual, will send patient to be tested.

## 2019-02-08 ENCOUNTER — Other Ambulatory Visit: Payer: Self-pay

## 2019-02-08 DIAGNOSIS — Z20822 Contact with and (suspected) exposure to covid-19: Secondary | ICD-10-CM

## 2019-02-13 ENCOUNTER — Telehealth: Payer: Self-pay | Admitting: Family Medicine

## 2019-02-13 LAB — NOVEL CORONAVIRUS, NAA: SARS-CoV-2, NAA: NOT DETECTED

## 2019-02-13 NOTE — Telephone Encounter (Signed)
Will forward to MD to advise. Jazmin Hartsell,CMA  

## 2019-02-13 NOTE — Telephone Encounter (Signed)
Pt was tested for COVID on 07/7 and was calling to check on the status of results.   Please call patient when results are available.  Best call back number is [phone]

## 2019-02-14 NOTE — Telephone Encounter (Signed)
Results are negative, please let patient know.

## 2019-02-14 NOTE — Telephone Encounter (Signed)
Patient informed and letter printed for her to return back to work.  Jazmin Hartsell,CMA

## 2020-01-24 ENCOUNTER — Ambulatory Visit: Payer: Self-pay | Admitting: Family Medicine

## 2020-01-24 NOTE — Progress Notes (Deleted)
    SUBJECTIVE:   CHIEF COMPLAINT / HPI:     ***due for pap, hep C screening, STI testing?  PERTINENT  PMH / PSH: Obesity, tobacco use  OBJECTIVE:   There were no vitals taken for this visit.  ***  ASSESSMENT/PLAN:   No problem-specific Assessment & Plan notes found for this encounter.     Ellwood Dense, DO [place] [facility]

## 2020-08-03 NOTE — L&D Delivery Note (Addendum)
Delivery Note At 1002 a viable female infant was delivered via SVD, presentation: LOA. APGAR: 8, 9; weight 4220g, 9'4.   Placenta status: spontaneously delivered intact with gentle cord traction. Fundus firm with massage and Pitocin.   Anesthesia: Nitrous Lacerations: 1st degree perineal Suture used for repair: 3-0 Vicryl rapide Est. Blood Loss (mL): 150 Placenta to LD Complications none Cord ph n/a   Mom to postpartum. Baby to Couplet care / Skin to Skin.    Donette Larry, CNM 06/16/2021 11:20 AM

## 2020-10-23 ENCOUNTER — Other Ambulatory Visit: Payer: Self-pay

## 2020-10-23 ENCOUNTER — Ambulatory Visit (INDEPENDENT_AMBULATORY_CARE_PROVIDER_SITE_OTHER): Payer: 59 | Admitting: Family Medicine

## 2020-10-23 VITALS — BP 115/60 | HR 85 | Ht 66.0 in | Wt 202.0 lb

## 2020-10-23 DIAGNOSIS — Z3490 Encounter for supervision of normal pregnancy, unspecified, unspecified trimester: Secondary | ICD-10-CM | POA: Insufficient documentation

## 2020-10-23 DIAGNOSIS — N926 Irregular menstruation, unspecified: Secondary | ICD-10-CM

## 2020-10-23 LAB — POCT URINALYSIS DIP (MANUAL ENTRY)
Bilirubin, UA: NEGATIVE
Blood, UA: NEGATIVE
Glucose, UA: NEGATIVE mg/dL
Ketones, POC UA: NEGATIVE mg/dL
Leukocytes, UA: NEGATIVE
Nitrite, UA: NEGATIVE
Protein Ur, POC: NEGATIVE mg/dL
Spec Grav, UA: >=1.03 — AB (ref 1.010–1.025)
Urobilinogen, UA: 0.2 E.U./dL
pH, UA: 5.5 (ref 5.0–8.0)

## 2020-10-23 LAB — POCT URINE PREGNANCY: Preg Test, Ur: POSITIVE — AB

## 2020-10-23 MED ORDER — PRENATAL MULTIVITAMIN CH: 1.0000 | ORAL_TABLET | Freq: Every day | ORAL | 9 refills | Status: AC

## 2020-10-23 NOTE — Assessment & Plan Note (Addendum)
Pt is a G2P0002 female who missed her March period and had an abnormal period in Feb. Last normal period was in Jan. Her other children are much older (late teens). She denies previous hx of gHTN or gDM. She would like to continue her care at [facility]. States though this was not planned her and her husband would welcome another child.  - declined OB labs today - dating ultrasound ordered - Start prenatal vitamins.  - Pt with hx of tobacco use. Advise to abstain from smoking.  - Discussed safe OTC medications

## 2020-10-23 NOTE — Progress Notes (Signed)
Called pt to discuss getting early dating ultrasound. Left secure voicemail. Will send MyChart message as well.   Katha Cabal, DO PGY-2,  Family Medicine 10/23/2020 12:31 PM

## 2020-10-23 NOTE — Patient Instructions (Addendum)
Congratulation!  Be sure to stop by the front to schedule your initial prenatal. AVOID ibuprofen, alleve, motrin.  It is safe to take Tylenol.  Dr. Rachael Darby   Obstetrics: Normal and Problem Pregnancies (7th ed., pp. 102-121). [place], [place]: Elsevier."> Textbook of Family Medicine (9th ed., pp. [id number]). [place], [place]: Elsevier [employer].">  First Trimester of Pregnancy  The first trimester of pregnancy starts on the first day of your last menstrual period until the end of week 12. This is months 1 through 3 of pregnancy. A week after a sperm fertilizes an egg, the egg will implant into the wall of the uterus and begin to develop into a baby. By the end of 12 weeks, all the baby's organs will be formed and the baby will be 2-3 inches in size. Body changes during your first trimester Your body goes through many changes during pregnancy. The changes vary and generally return to normal after your baby is born. Physical changes  You may gain or lose weight.  Your breasts may begin to grow larger and become tender. The tissue that surrounds your nipples (areola) may become darker.  Dark spots or blotches (chloasma or mask of pregnancy) may develop on your face.  You may have changes in your hair. These can include thickening or thinning of your hair or changes in texture. Health changes  You may feel nauseous, and you may vomit.  You may have heartburn.  You may develop headaches.  You may develop constipation.  Your gums may bleed and may be sensitive to brushing and flossing. Other changes  You may tire easily.  You may urinate more often.  Your menstrual periods will stop.  You may have a loss of appetite.  You may develop cravings for certain kinds of food.  You may have changes in your emotions from day to day.  You may have more vivid and strange dreams. Follow these instructions at home: Medicines  Follow your health care provider's instructions regarding medicine  use. Specific medicines may be either safe or unsafe to take during pregnancy. Do not take any medicines unless told to by your health care provider.  Take a prenatal vitamin that contains at least 600 micrograms (mcg) of folic acid. Eating and drinking  Eat a healthy diet that includes fresh fruits and vegetables, whole grains, good sources of protein such as meat, eggs, or tofu, and low-fat dairy products.  Avoid raw meat and unpasteurized juice, milk, and cheese. These carry germs that can harm you and your baby.  If you feel nauseous or you vomit: ? Eat 4 or 5 small meals a day instead of 3 large meals. ? Try eating a few soda crackers. ? Drink liquids between meals instead of during meals.  You may need to take these actions to prevent or treat constipation: ? Drink enough fluid to keep your urine pale yellow. ? Eat foods that are high in fiber, such as beans, whole grains, and fresh fruits and vegetables. ? Limit foods that are high in fat and processed sugars, such as fried or sweet foods. Activity  Exercise only as directed by your health care provider. Most people can continue their usual exercise routine during pregnancy. Try to exercise for 30 minutes at least 5 days a week.  Stop exercising if you develop pain or cramping in the lower abdomen or lower back.  Avoid exercising if it is very hot or humid or if you are at high altitude.  Avoid heavy lifting.  If you choose to, you may have sex unless your health care provider tells you not to. Relieving pain and discomfort  Wear a good support bra to relieve breast tenderness.  Rest with your legs elevated if you have leg cramps or low back pain.  If you develop bulging veins (varicose veins) in your legs: ? Wear support hose as told by your health care provider. ? Elevate your feet for 15 minutes, 3-4 times a day. ? Limit salt in your diet. Safety  Wear your seat belt at all times when driving or riding in a  car.  Talk with your health care provider if someone is verbally or physically abusive to you.  Talk with your health care provider if you are feeling sad or have thoughts of hurting yourself. Lifestyle  Do not use hot tubs, steam rooms, or saunas.  Do not douche. Do not use tampons or scented sanitary pads.  Do not use herbal remedies, alcohol, illegal drugs, or medicines that are not approved by your health care provider. Chemicals in these products can harm your baby.  Do not use any products that contain nicotine or tobacco, such as cigarettes, e-cigarettes, and chewing tobacco. If you need help quitting, ask your health care provider.  Avoid cat litter boxes and soil used by cats. These carry germs that can cause birth defects in the baby and possibly loss of the unborn baby (fetus) by miscarriage or stillbirth. General instructions  During routine prenatal visits in the first trimester, your health care provider will do a physical exam, perform necessary tests, and ask you how things are going. Keep all follow-up visits. This is important.  Ask for help if you have counseling or nutritional needs during pregnancy. Your health care provider can offer advice or refer you to specialists for help with various needs.  Schedule a dentist appointment. At home, brush your teeth with a soft toothbrush. Floss gently.  Write down your questions. Take them to your prenatal visits. Where to find more information  American Pregnancy Association: americanpregnancy.org  [employer] and Gynecologists: https://www.todd-brady.net/  Office on [employer] [facility]: MightyReward.co.nz Contact a health care provider if you have:  Dizziness.  A fever.  Mild pelvic cramps, pelvic pressure, or nagging pain in the abdominal area.  Nausea, vomiting, or diarrhea that lasts for 24 hours or longer.  A bad-smelling vaginal discharge.  Pain when you  urinate.  Known exposure to a contagious illness, such as chickenpox, measles, Zika virus, HIV, or hepatitis. Get help right away if you have:  Spotting or bleeding from your vagina.  Severe abdominal cramping or pain.  Shortness of breath or chest pain.  Any kind of trauma, such as from a fall or a car crash.  New or increased pain, swelling, or redness in an arm or leg. Summary  The first trimester of pregnancy starts on the first day of your last menstrual period until the end of week 12 (months 1 through 3).  Eating 4 or 5 small meals a day rather than 3 large meals may help to relieve nausea and vomiting.  Do not use any products that contain nicotine or tobacco, such as cigarettes, e-cigarettes, and chewing tobacco. If you need help quitting, ask your health care provider.  Keep all follow-up visits. This is important. This information is not intended to replace advice given to you by your health care provider. Make sure you discuss any questions you have with your health care provider. Document Revised:  12/27/2019 Document Reviewed: 11/02/2019 Elsevier Patient Education  2021 [employer].

## 2020-10-23 NOTE — Progress Notes (Signed)
   SUBJECTIVE:   CHIEF COMPLAINT / HPI:   Chief Complaint  Patient presents with  . Menstrual Problem    Pt stated that she missed her period.     KOLLEEN OCHSNER is a G19P2  40 y.o. female here for positive home pregnancy test. She took only test. Patient's last menstrual period was 09/06/2020. This period was abnormal as it didn't last very long. She typically bleeds for 5 days and this time she only bleed for 3 days. Her period in January was normal. Denies vaginal bleeding, vaginal discharge, dysuria, urinary urgency. Has urinary frequency.  Endorses abdominal pressure that has been present over the last week.    PERTINENT  PMH / PSH: reviewed and updated as appropriate   OBJECTIVE:   BP 115/60   Pulse 85   Ht 5\' 6"  (1.676 m)   Wt 202 lb (91.6 kg)   LMP 09/06/2020   SpO2 99%   BMI 32.60 kg/m    GEN: well appearing female in no acute distress  CVS: well perfused  RESP: speaking in full sentences without pause  ABD: soft, non-tender, non-distended, no palpable masses, uterus no palpable  Skin: warm dry    ASSESSMENT/PLAN:   Pregnancy Pt is a 11/04/2020 female who missed her March period and had an abnormal period in Feb. Last normal period was in Jan. Her other children are much older (late teens). She denies previous hx of gHTN or gDM. She would like to continue her care at [facility]. States though this was not planned her and her husband would welcome another child.  - declined OB labs today - dating ultrasound ordered    [facility], DO PGY-2, [facility] 10/23/2020

## 2020-10-31 ENCOUNTER — Ambulatory Visit
Admission: RE | Admit: 2020-10-31 | Discharge: 2020-10-31 | Disposition: A | Discharge status: Home / Self Care | Payer: 59 | Source: Ambulatory Visit | Attending: Family Medicine | Admitting: Family Medicine

## 2020-10-31 ENCOUNTER — Other Ambulatory Visit: Payer: Self-pay

## 2020-10-31 ENCOUNTER — Other Ambulatory Visit: Payer: Self-pay | Admitting: Family Medicine

## 2020-10-31 DIAGNOSIS — Z3490 Encounter for supervision of normal pregnancy, unspecified, unspecified trimester: Secondary | ICD-10-CM | POA: Insufficient documentation

## 2020-11-24 NOTE — Progress Notes (Signed)
Patient Name: Jennifer Cabrera Date of Birth: [birth year] [facility] Initial Prenatal   Jennifer Cabrera is a 40 y.o. year old G3P2 at [redacted]w[redacted]d based on 8 wk [place] who presents for her initial prenatal visit. Pregnancy  isplanned She reports breast tenderness and frequent urination. She  is taking a prenatal vitamin.  She denies pelvic pain or vaginal bleeding.   The patient is dated by ultrasound.  LMP: 09/06/20 Period is certain: Pretty certain Periods are regular:  Yes.  Period was typical period:  No. Shorter period Using hormonal contraception in 3 months prior to conception:  No   Lab Review: OB labs not obtained at prior visit. Ordered today.   PMH: Reviewed and as detailed below: HTN: No  Type 1 or 2 Diabetes: No f Depression:  No  Seizure disorder:  No  VTE: No , if yes,refer to High Risk Ob  History of STI Yes - History of Gonorrhea or Chlamydia 5-6 years ago Abnormal Pap smear:  Yes,ASCUS in 2013, 2016 normal  Genital herpes simplex:  No   PSH: Gynecologic Surgery:  no Surgical history reviewed.   Obstetric History: Obstetric history tab updated and reviewed.  Cesarean delivery: No  Gestational Diabetes:  No  Hypertension in pregnancy: No Prior pregnancies: History of preterm birth: No Complications with prior pregnancies: None History of LGA/SGA infant:  No History of shoulder dystocia: No Indications for referral were reviewed, the patient has no obstetric indications for referral to High Risk Ob at this time.   Social History: Partner's name: Maza Tachime Tobacco use: No Alcohol use:  No Other substance use:  No  Current Medications:  Prenatal  Reviewed and appropriate in pregnancy.   Genetic and Infection Screen: Flow Sheet Updated Yes  Prenatal Exam: Gen: Well nourished, well developed.  No distress.  Vitals noted. HEENT: Normocephalic, atraumatic.  Neck supple.  fair dentition. CV: RRR no murmur, gallops or rubs Lungs: CTAB.  Normal  respiratory effort without wheezes or rales. Abd: soft, NTND. +BS.  Uterus appreciated slightly above pelvis. GU: Normal external female genitalia without lesions.  Nl vaginal, well rugated without lesions. No vaginal discharge.  Bimanual exam: No adnexal mass or TTP. No CMT.  Ext: No clubbing, cyanosis or edema. Psych: Normal grooming and dress.  Not depressed or anxious appearing.  Normal thought content and process without flight of ideas or looseness of associations  Depression screen [facility] 2/9 11/27/2020 10/23/2020 01/25/2019  Decreased Interest 0 0 0  Down, Depressed, Hopeless 0 0 0  PHQ - 2 Score 0 0 0  Altered sleeping 0 0 -  Tired, decreased energy 0 0 -  Change in appetite 0 0 -  Feeling bad or failure about yourself  0 0 -  Trouble concentrating 0 0 -  Moving slowly or fidgety/restless 0 0 -  Suicidal thoughts 0 0 -  PHQ-9 Score 0 0 -  Difficult doing work/chores - Not difficult at all -    Assessment/plan: 1) Prenatal Care: . Pregnancy [redacted]w[redacted]d doing well.  . Dating ultrasound reviewed: Patient is [redacted]w[redacted]d with EDC of 06/09/21 . Prepregnancy weight updated and expected weight gain this pregnancy is 11-20 pounds   o Prepregnancy weight 197lbs. BMI 31.  . Prenatal labs ordered and to be reviewed at follow up visit  . Indications for referral to HROB were reviewed and the patient does not meet criteria for referral.   . Medication list reviewed and updated to include only medications current medications.  Marland Kitchen  Recommended patient see a dentist for regular care.  . Prescribed vitamin B6 and doxylamine (Diclegis), with discussion of dose titration. . Bleeding and pain precautions reviewed. . Importance of prenatal vitamins reviewed. . Based on age, ethnicity, and BMI - ACOG recommend daily Aspirin 81mg . Patient was recommended to start.  . Genetic screening offered, including first trimester screen with nuchal translucency at 11-13 weeks or quad screen at 16-20 weeks. Patient declined at  this time. Would like to talk to partner. Recommend continued conversation.  . The patient is age 29 or over at time of delivery. Referral to genetics was offered today. Patient declined at this time. Would like to talk to partner. Recommend continued conversation.  . The patient does meet criteria for an early 1 hour glucola testing for diabetes. She will undergo routine 1 hour screening at 24-28 weeks. Test ordered and to be completed at next OB visit on 12/24/20 . PMH and PHQ9 forms completed.  . Early glucola is indicated.   Anticipatory Guidance and Prenatal Education provided on the following topics: - Reviewed nutrition in pregnancy, avoiding cat litter, unpasteurized cheeses - Recommended continuing Prenatal vitamin   - Discussed options for Contraception postpartum - Discussed mode of delivery - Discussed pain control in labor - Discussed breastfeeding and benefits for infant  - Problem list and prenatal box updated    Follow up 4 weeks for next prenatal visit.  12/26/20, DO [facility], PGY3 11/27/2020 11:16 AM

## 2020-11-27 ENCOUNTER — Ambulatory Visit (INDEPENDENT_AMBULATORY_CARE_PROVIDER_SITE_OTHER): Payer: 59 | Admitting: Family Medicine

## 2020-11-27 ENCOUNTER — Encounter: Payer: Self-pay | Admitting: Family Medicine

## 2020-11-27 ENCOUNTER — Other Ambulatory Visit: Payer: Self-pay

## 2020-11-27 ENCOUNTER — Other Ambulatory Visit (HOSPITAL_COMMUNITY)
Admission: RE | Admit: 2020-11-27 | Discharge: 2020-11-27 | Disposition: A | Discharge status: Home / Self Care | Payer: 59 | Source: Ambulatory Visit | Attending: Family Medicine | Admitting: Family Medicine

## 2020-11-27 VITALS — BP 100/58 | HR 96 | Wt 205.6 lb

## 2020-11-27 DIAGNOSIS — Z3491 Encounter for supervision of normal pregnancy, unspecified, first trimester: Secondary | ICD-10-CM | POA: Diagnosis present

## 2020-11-27 DIAGNOSIS — Z3490 Encounter for supervision of normal pregnancy, unspecified, unspecified trimester: Secondary | ICD-10-CM | POA: Insufficient documentation

## 2020-11-27 LAB — OB RESULTS CONSOLE GC/CHLAMYDIA: Gonorrhea: NEGATIVE

## 2020-11-27 MED ORDER — ASPIRIN EC 81 MG PO TBEC: 81.0000 mg | DELAYED_RELEASE_TABLET | Freq: Every day | ORAL | 2 refills | Status: DC

## 2020-11-27 NOTE — Assessment & Plan Note (Signed)
Clinic  [facility] Prenatal Labs  Dating  8 wk dating [place] Blood type:     Genetic Screen 1 Screen:    AFP:     Quad:     NIPS: Antibody:   Anatomic [place]  Rubella:    GTT Early:    Scheduled           Third trimester:  RPR:     Flu vaccine  HBsAg:     TDaP vaccine                                               Rhogam: HIV:     Baby Food                                               GBS: (For PCN allergy, check sensitivities)  Contraception  Pap:  Circumcision    Pediatrician    Support Person  Maza Tachime

## 2020-11-27 NOTE — Patient Instructions (Signed)
Please start Aspirin 81 mg daily Continue your prenatal daily Avoid soft and unpasteurized cheeses, deli meats, cat litter, alcohol, tobacco, and NSAIDs (Ibuprofen, Motrin, Naproxen, Aleve,).  You can take Tylenol for headaches or other aches or pains Please follow up on 12/24/20 at 8:30 am for your gestational diabetes screen and next OB appointment Please consider genetics testing.   Safe Medications in Pregnancy   Acne: Benzoyl Peroxide Salicylic Acid  Backache/Headache: Tylenol: 2 regular strength every 4 hours OR              2 Extra strength every 6 hours  Colds/Coughs/Allergies: Benadryl (alcohol free) 25 mg every 6 hours as needed Breath right strips Claritin Cepacol throat lozenges Chloraseptic throat spray Cold-Eeze- up to three times per day Cough drops, alcohol free Flonase (by prescription only) Guaifenesin Mucinex Robitussin DM (plain only, alcohol free) Saline nasal spray/drops Sudafed (pseudoephedrine) & Actifed ** use only after [redacted] weeks gestation and if you do not have high blood pressure Tylenol Vicks Vaporub Zinc lozenges Zyrtec   Constipation: Colace Ducolax suppositories Fleet enema Glycerin suppositories Metamucil Milk of magnesia Miralax Senokot Smooth move tea  Diarrhea: Kaopectate Imodium A-D  *NO pepto Bismol  Hemorrhoids: Anusol Anusol HC Preparation H Tucks  Indigestion: Tums Maalox Mylanta Zantac  Pepcid  Insomnia: Benadryl (alcohol free) 25mg  every 6 hours as needed Tylenol PM Unisom, no Gelcaps  Leg Cramps: Tums MagGel  Nausea/Vomiting:  Bonine Dramamine Emetrol Ginger extract Sea bands Meclizine  Nausea medication to take during pregnancy:  Unisom (doxylamine succinate 25 mg tablets) Take one tablet daily at bedtime. If symptoms are not adequately controlled, the dose can be increased to a maximum recommended dose of two tablets daily (1/2 tablet in the morning, 1/2 tablet mid-afternoon and one at  bedtime). Vitamin B6 100mg  tablets. Take one tablet twice a day (up to 200 mg per day).  Skin Rashes: Aveeno products Benadryl cream or 25mg  every 6 hours as needed Calamine Lotion 1% cortisone cream  Yeast infection: Gyne-lotrimin 7 Monistat 7   **If taking multiple medications, please check labels to avoid duplicating the same active ingredients **take medication as directed on the label ** Do not exceed 4000 mg of tylenol in 24 hours **Do not take medications that contain aspirin or ibuprofen  Reasons to return to MAU: 1.  Contractions are  5 minutes apart or less, each last 1 minute, these have been going on for 1-2 hours, and you cannot walk or talk during them 2.  You have a large gush of fluid, or a trickle of fluid that will not stop and you have to wear a pad 3.  You have bleeding that is bright red, heavier than spotting--like menstrual bleeding (spotting can be normal in early labor or after a check of your cervix) 4.  You do not feel the baby moving like he/she normally does

## 2020-11-29 LAB — OBSTETRIC PANEL, INCLUDING HIV
Antibody Screen: NEGATIVE
Basophils Absolute: 0 10*3/uL (ref 0.0–0.2)
Basos: 0 %
EOS (ABSOLUTE): 0.1 10*3/uL (ref 0.0–0.4)
Eos: 1 %
HIV Screen 4th Generation wRfx: NONREACTIVE
Hematocrit: 30.7 % — ABNORMAL LOW (ref 34.0–46.6)
Hemoglobin: 9.6 g/dL — ABNORMAL LOW (ref 11.1–15.9)
Hepatitis B Surface Ag: NEGATIVE
Immature Grans (Abs): 0 10*3/uL (ref 0.0–0.1)
Immature Granulocytes: 1 %
Lymphocytes Absolute: 0.8 10*3/uL (ref 0.7–3.1)
Lymphs: 14 %
MCH: 21.5 pg — ABNORMAL LOW (ref 26.6–33.0)
MCHC: 31.3 g/dL — ABNORMAL LOW (ref 31.5–35.7)
MCV: 69 fL — ABNORMAL LOW (ref 79–97)
Monocytes Absolute: 0.5 10*3/uL (ref 0.1–0.9)
Monocytes: 8 %
Neutrophils Absolute: 4.3 10*3/uL (ref 1.4–7.0)
Neutrophils: 76 %
Platelets: 171 10*3/uL (ref 150–450)
RBC: 4.46 x10E6/uL (ref 3.77–5.28)
RDW: 15.7 % — ABNORMAL HIGH (ref 11.7–15.4)
RPR Ser Ql: NONREACTIVE
Rh Factor: POSITIVE
Rubella Antibodies, IGG: 1.33 index (ref >0.99)
WBC: 5.6 10*3/uL (ref 3.4–10.8)

## 2020-11-29 LAB — HGB FRACTIONATION CASCADE
Hgb A2: 2.4 % (ref 1.8–3.2)
Hgb A: 97.6 % (ref 96.4–98.8)
Hgb F: 0 % (ref 0.0–2.0)
Hgb S: 0 %

## 2020-11-29 LAB — CULTURE, OB URINE

## 2020-11-29 LAB — HCV AB W REFLEX TO QUANT PCR: HCV Ab: <0.1 s/co ratio (ref 0.0–0.9)

## 2020-11-29 LAB — VARICELLA ZOSTER ANTIBODY, IGG: Varicella zoster IgG: 2989 index (ref >165)

## 2020-11-29 LAB — HCV INTERPRETATION

## 2020-11-29 LAB — URINE CULTURE, OB REFLEX: Organism ID, Bacteria: NO GROWTH

## 2020-12-02 LAB — CYTOLOGY - PAP
Chlamydia: NEGATIVE
Comment: NEGATIVE
Comment: NEGATIVE
Comment: NEGATIVE
Comment: NORMAL
High risk HPV: POSITIVE — AB
Neisseria Gonorrhea: NEGATIVE
Trichomonas: NEGATIVE

## 2020-12-24 ENCOUNTER — Other Ambulatory Visit: Payer: Self-pay

## 2020-12-24 ENCOUNTER — Telehealth: Payer: Self-pay

## 2020-12-24 ENCOUNTER — Other Ambulatory Visit: Payer: 59

## 2020-12-24 ENCOUNTER — Telehealth: Payer: Self-pay | Admitting: Family Medicine

## 2020-12-24 ENCOUNTER — Ambulatory Visit (INDEPENDENT_AMBULATORY_CARE_PROVIDER_SITE_OTHER): Payer: 59 | Admitting: Family Medicine

## 2020-12-24 VITALS — BP 100/70 | HR 94 | Wt 213.6 lb

## 2020-12-24 DIAGNOSIS — Z3491 Encounter for supervision of normal pregnancy, unspecified, first trimester: Secondary | ICD-10-CM

## 2020-12-24 NOTE — Telephone Encounter (Signed)
Clinical info completed on FMLA form.  Place form in Dr. Walsh's box for completion.  Jennifer Cabrera T Blanka Rockholt, CMA  

## 2020-12-24 NOTE — Telephone Encounter (Signed)
Spoke with patient about adding OTC iron supplement to her diet. She voiced understanding.   Everitt Wenner Autry-Lott, DO 12/24/2020, 1:50 PM PGY-2, [facility]

## 2020-12-24 NOTE — Progress Notes (Signed)
  Patient Name: ANIKKA MARSAN Date of Birth: [birth year] [facility] Prenatal Visit  HARSHITHA FRETZ is a 40 y.o. G3P0002 at [redacted]w[redacted]d here for routine follow up. She is dated by early ultrasound.  She reports no complaints.  She denies vaginal bleeding.  See flow sheet for details.  Vitals:   12/24/20 0922  BP: 100/70  Pulse: 94   A/P: Pregnancy at [redacted]w[redacted]d.  Doing well.    1. Routine Prenatal Care:  Marland Kitchen Dating reviewed, dating tab is correct . Fetal heart tones Appropriate . Influenza vaccine not administered as patient declined, will continue to discuss.   Marland Kitchen COVID vaccination was discussed and patient is due for booster.  . The patient has the following indication for screening preexisting diabetes: BMI > 25 and high risk ethnicity (Latino, [place] American, Native American, Mal[place] Islander, Asian Naval architect) . 1 hr gtt obtained today . Anatomy ultrasound ordered to be scheduled at 18-20 weeks. . Patient is not interested in genetic screening. . Pregnancy education including expected weight gain in pregnancy, OTC medication use, continued use of prenatal vitamin, smoking cessation if applicable, and nutrition in pregnancy.   . Bleeding and pain precautions reviewed.  2. Pregnancy issues include the following and were addressed as appropriate today:  . Anemia in pregnancy Hgb 9.6. Has metallic taste in mouth. Taking prenatal vitamin with iron supplement.  -Obtain ferritin, recommend additional iron supplement OTC . Problem list  and pregnancy box updated: No.   Follow up 4 weeks.

## 2020-12-24 NOTE — Telephone Encounter (Signed)
FMLA Leave form dropped off for at front desk for completion.  Verified that patient section of form has been completed.  Last DOS/WCC with PCP was 12/24/20.  Placed form in team folder to be completed by clinical staff.  [employer]

## 2020-12-24 NOTE — Patient Instructions (Addendum)
It was wonderful to see you today.  Today we talked about:  We will follow-up 1 hour test and test for iron and notify you if abnormal.   We have scheduled you for your anatomy scan below.   Follow up in 4 weeks for next OB visit.   Please be sure to schedule follow up at the front  desk before you leave today.   If you haven't already, sign up for My Chart to have easy access to your labs results, and communication with your primary care physician.  Please call the clinic at [phone] if your symptoms worsen or you have any concerns. It was our pleasure to serve you.  Dr. Salvadore Dom

## 2020-12-25 LAB — FERRITIN: Ferritin: 9 ng/mL — ABNORMAL LOW (ref 15–150)

## 2020-12-25 LAB — GLUCOSE TOLERANCE, 1 HOUR: Glucose, 1Hr PP: 104 mg/dL (ref 65–199)

## 2020-12-29 NOTE — Telephone Encounter (Signed)
Forms completed and placed in front on 05/29 at 1730 hrs for patient pick.  Please call patient to inform her.  Thank you Dana Allan, MD Family Medicine Residency

## 2021-01-08 ENCOUNTER — Ambulatory Visit (INDEPENDENT_AMBULATORY_CARE_PROVIDER_SITE_OTHER): Payer: 59 | Admitting: Family Medicine

## 2021-01-08 ENCOUNTER — Other Ambulatory Visit: Payer: Self-pay

## 2021-01-08 VITALS — BP 110/72 | HR 94 | Wt 214.0 lb

## 2021-01-08 DIAGNOSIS — R600 Localized edema: Secondary | ICD-10-CM | POA: Diagnosis not present

## 2021-01-08 NOTE — Progress Notes (Signed)
   Subjective:   Patient ID: Jennifer Cabrera    DOB: 1980/08/26, 40 y.o. female   MRN: [id number]  Jennifer Cabrera is a 40 y.o. female who is G3P2 at [redacted]w[redacted]d, dated by early [place], here for leg swelling   HPI:   Patient notes development of leg swelling in feet and ankles, bilaterally, notes that it has been equal. Improves with elevation. She notes that she is on her feet all day, 10+ hours.  History of this before in 2020. Negative for proteinuria at that time. She was recommended to treat compression stockings, elevation, weight loss through diet and exercise, and DASH diet.   New medications: None. Just prenatal vitamin, ASA, and iron pill Trauma/injury: none History of blood clots: None Denies any recent illnesses. Denies any difficulty breathing/SOB or CP.    Review of Systems:  Per HPI.   Objective:   BP 110/72   Pulse 94   Wt 214 lb (97.1 kg)   LMP 09/06/2020 (Approximate)   BMI 34.54 kg/m  Vitals and nursing note reviewed.  General: pleasant young female sitting comfortably on exam bed,  well nourished, well developed, in no acute distress with non-toxic appearance CV: regular rate and rhythm without murmurs, rubs, or gallops, trace lower extremity edema bilaterally up to knee, 2+ pedal pulses bilaterally Lungs: clear to auscultation bilaterally with normal work of breathing on room air, speaking in full sentences Skin: warm, dry Extremities: warm and well perfused, normal tone, gait normal, symmetric calf sizes Neuro: Alert and oriented, speech normal  FHR: 148 bpm  Assessment & Plan:   Mild peripheral edema Acute.  Suspect likely secondary to pregnancy related changes and prolonged hours of standing.  Does improve with elevation.  No signs of DVT.  Patient is currently [redacted] weeks pregnant with normal blood pressure thus does not meet criteria for preeclampsia.  Recommended elevation as often as possible, compression stockings.  Encouraged to wear comfortable shoes.  Work note  provided.  Return precautions discussed  No orders of the defined types were placed in this encounter.  No orders of the defined types were placed in this encounter.     Orpah Cobb, DO PGY-3, [facility] 01/08/2021 5:44 PM

## 2021-01-08 NOTE — Patient Instructions (Signed)
Please use compression stockings every day starting first thing in the morning.  Remove prior to bed. Try to elevate your feet as often as possible. I provided you a work note requesting that you be allowed to sit for 30 minutes every 2-3 hours to help with the swelling. Reasons to come back include shortness of breath, chest pain, worsening lower extremity swelling particularly if it is asymmetric (one leg being worse than the other).

## 2021-01-08 NOTE — Assessment & Plan Note (Addendum)
Acute.  Suspect likely secondary to pregnancy related changes and prolonged hours of standing.  Does improve with elevation.  No signs of DVT.  Patient is currently [redacted] weeks pregnant with normal blood pressure thus does not meet criteria for preeclampsia.  Recommended elevation as often as possible, compression stockings.  Encouraged to wear comfortable shoes.  Work note provided.  Return precautions discussed

## 2021-01-13 ENCOUNTER — Telehealth: Payer: Self-pay

## 2021-01-13 NOTE — Telephone Encounter (Signed)
Pt has signed for and picked up the form. Sunday Spillers, CMA

## 2021-01-13 NOTE — Telephone Encounter (Signed)
Patient calls nurse line stating she needs recent work restrictions letter updated to state the duration of her pregnancy. Letter has been updated and left up front for pick up.

## 2021-01-15 ENCOUNTER — Other Ambulatory Visit: Payer: Self-pay | Admitting: Obstetrics and Gynecology

## 2021-01-15 ENCOUNTER — Ambulatory Visit: Payer: 59 | Attending: Obstetrics and Gynecology | Admitting: Obstetrics and Gynecology

## 2021-01-15 ENCOUNTER — Other Ambulatory Visit: Payer: Self-pay

## 2021-01-15 ENCOUNTER — Ambulatory Visit: Payer: 59 | Admitting: *Deleted

## 2021-01-15 ENCOUNTER — Ambulatory Visit: Payer: 59 | Attending: Obstetrics and Gynecology

## 2021-01-15 ENCOUNTER — Encounter: Payer: Self-pay | Admitting: *Deleted

## 2021-01-15 ENCOUNTER — Other Ambulatory Visit: Payer: Self-pay | Admitting: *Deleted

## 2021-01-15 VITALS — BP 118/73 | HR 91

## 2021-01-15 DIAGNOSIS — O09522 Supervision of elderly multigravida, second trimester: Secondary | ICD-10-CM

## 2021-01-15 DIAGNOSIS — Z3A19 19 weeks gestation of pregnancy: Secondary | ICD-10-CM

## 2021-01-15 DIAGNOSIS — O358XX0 Maternal care for other (suspected) fetal abnormality and damage, not applicable or unspecified: Secondary | ICD-10-CM | POA: Diagnosis not present

## 2021-01-15 DIAGNOSIS — O350XX0 Maternal care for (suspected) central nervous system malformation in fetus, not applicable or unspecified: Secondary | ICD-10-CM

## 2021-01-15 DIAGNOSIS — O99210 Obesity complicating pregnancy, unspecified trimester: Secondary | ICD-10-CM

## 2021-01-15 DIAGNOSIS — O3500X0 Maternal care for (suspected) central nervous system malformation or damage in fetus, unspecified, not applicable or unspecified: Secondary | ICD-10-CM

## 2021-01-15 DIAGNOSIS — F17210 Nicotine dependence, cigarettes, uncomplicated: Secondary | ICD-10-CM | POA: Diagnosis not present

## 2021-01-15 DIAGNOSIS — Z3491 Encounter for supervision of normal pregnancy, unspecified, first trimester: Secondary | ICD-10-CM

## 2021-01-15 DIAGNOSIS — O99332 Smoking (tobacco) complicating pregnancy, second trimester: Secondary | ICD-10-CM | POA: Diagnosis not present

## 2021-01-15 DIAGNOSIS — O99212 Obesity complicating pregnancy, second trimester: Secondary | ICD-10-CM

## 2021-01-15 DIAGNOSIS — E669 Obesity, unspecified: Secondary | ICD-10-CM

## 2021-01-15 DIAGNOSIS — O321XX0 Maternal care for breech presentation, not applicable or unspecified: Secondary | ICD-10-CM

## 2021-01-15 NOTE — Progress Notes (Signed)
Ms. Jennifer Cabrera, [place] P2, is here for fetal anatomy scan.  Advanced maternal age.  Patient had opted not to screen for fetal aneuploidies. Obstetric history significant for 2 term vaginal deliveries.  Early screening ruled out gestational diabetes.  Blood pressure today at her office is 118/73 mmHg. We performed a fetal anatomy scan.  An echogenic intracardiac focus is seen.  Unilateral ventriculomegaly measuring 11 millimeters is seen.  No other markers of aneuploidies or fetal structural defects are seen.  Intracranial structures, otherwise, appear normal. No abdominal or periventricular calcifications are seen.  Our concerns include: Ventriculomegaly:  Ventriculomegaly can be associated with fetal chromosomal malformations including Down syndrome, genetic syndromes, infection, and normal fetus. I reassured the couple that in most cases, isolated borderline ventriculomegaly is not associated with adverse neurodevelopmental outcomes. It is also likely that ventriculomegaly may resolve with advancing gestation. If progression is seen, MRI may be advised to rule out structural anomalies. MRI may detect additional abnormalities not detected by ultrasound, but its detection rate improves after 24 weeks' gestation.  I discussed amniocentesis to rule out chromosomal anomalies. Amniocentesis gives information on full karyotype and microarray can also be performed to detect single gene disorders. If amniocentesis is performed, we will recommend infectious work up (toxo and CMV PCR on amniotic fluid).  Echogenic intracardiac focus: Echogenic intracardiac focus is seen in about 3% to 4% of normal fetuses, and in about 15%-20% of fetuses with Down syndrome. She was reassured that echogenic focus is not associated with any structural heart malformations. Presence of this isolated marker only slightly increases the a priori risk for Down syndrome.  I discussed the following options: 1) Maternal blood cell-free fetal  DNA screening  for trisomies 21, 18 and 13, which has a greater detection rate than conventional screening tests. I informed the patient that not all chromosomal malformations are detected by this test. 2) I informed her that only amniocentesis will give a definitive result on the fetal karyotype. I discussed a procedure-related pregnancy loss of about 1 in 500.  After counseling, the patient informed she will take time to decide and will have her blood drawn at your office for cell-free fetal DNA screening. She opted not to have amniocentesis.  Recommendations: -Cell-free fetal DNA screening at your office. -An appointment was made for her to return in 4 weeks for completion of fetal anatomy.  Thank you for consultation. If you have any questions, please contact me at the [facility]. Consultation including face-to-face counseling 30 minutes.

## 2021-02-13 ENCOUNTER — Encounter: Payer: Self-pay | Admitting: *Deleted

## 2021-02-13 ENCOUNTER — Ambulatory Visit: Payer: 59 | Admitting: *Deleted

## 2021-02-13 ENCOUNTER — Ambulatory Visit: Payer: 59 | Attending: Obstetrics and Gynecology

## 2021-02-13 ENCOUNTER — Other Ambulatory Visit: Payer: Self-pay

## 2021-02-13 VITALS — BP 119/80 | HR 84

## 2021-02-13 DIAGNOSIS — O358XX0 Maternal care for other (suspected) fetal abnormality and damage, not applicable or unspecified: Secondary | ICD-10-CM

## 2021-02-13 DIAGNOSIS — Z3A23 23 weeks gestation of pregnancy: Secondary | ICD-10-CM

## 2021-02-13 DIAGNOSIS — E669 Obesity, unspecified: Secondary | ICD-10-CM | POA: Diagnosis not present

## 2021-02-13 DIAGNOSIS — O283 Abnormal ultrasonic finding on antenatal screening of mother: Secondary | ICD-10-CM | POA: Insufficient documentation

## 2021-02-13 DIAGNOSIS — O99212 Obesity complicating pregnancy, second trimester: Secondary | ICD-10-CM | POA: Diagnosis not present

## 2021-02-13 DIAGNOSIS — O09522 Supervision of elderly multigravida, second trimester: Secondary | ICD-10-CM

## 2021-02-14 ENCOUNTER — Other Ambulatory Visit: Payer: Self-pay | Admitting: *Deleted

## 2021-02-14 DIAGNOSIS — O283 Abnormal ultrasonic finding on antenatal screening of mother: Secondary | ICD-10-CM

## 2021-02-19 ENCOUNTER — Encounter: Payer: Self-pay | Admitting: Family Medicine

## 2021-02-19 ENCOUNTER — Ambulatory Visit (INDEPENDENT_AMBULATORY_CARE_PROVIDER_SITE_OTHER): Payer: 59 | Admitting: Family Medicine

## 2021-02-19 ENCOUNTER — Other Ambulatory Visit: Payer: Self-pay

## 2021-02-19 VITALS — BP 102/60 | HR 93 | Wt 218.5 lb

## 2021-02-19 DIAGNOSIS — Z3491 Encounter for supervision of normal pregnancy, unspecified, first trimester: Secondary | ICD-10-CM

## 2021-02-19 NOTE — Patient Instructions (Addendum)
Baby boy is growing well! Congratulations!  See you back in 4 weeks.   Be sure to take baby aspirin daily.   Take Care,   Dr. Rachael Darby

## 2021-02-19 NOTE — Progress Notes (Signed)
       [facility] Prenatal Visit  Jennifer Cabrera is a 40 y.o. G3P2002 at [redacted]w[redacted]d here for routine follow up. She is dated by early ultrasound.  She reports  leg swelling .  She reports good fetal movement. No bleeding, loss of fluid, contractions. See flow sheet for details. Vitals:   02/19/21 0933  BP: 102/60  Pulse: 93     A/P: Pregnancy at [redacted]w[redacted]d.  Doing well.   Dating reviewed, dating tab is correct Fetal heart tones Appropriate Fundal height within expected range.  Anatomy ultrasound reviewed and notable for none.  Influenza vaccine not administered as not influenza season. Marland Kitchen  COVID vaccination was discussed and pt is due for booster.   Discuss at next visit.  Indications for screening for preexisting diabetes include: BMI > 25 and high risk ethnicity (Latino, [place] American, Native American, Mal[place] Islander, Asian Naval architect) . Ordered today but pt declined as she states she just had one. Advised to get prior to next visit. Pt to return for lab visit prior to next visit.  Pregnancy education provided on the following topics: fetal growth and movement, ultrasound assessment, and upcoming laboratory assessment.  Continue fetal growth assessments per AMA protocol.  Scheduled for [facility] during third trimester on 04/24/21. Preterm labor precautions given.   2. Pregnancy issues include the following and were addressed as appropriate today:  IDA: Previous ferritin 9. Hgb 9.6 in April.  Taking iron every other day. Recommended daily iron with food. Taking baby Asprin for pre-E ppx. Anatomy ultrasound completed 02/13/22.  Follow up growth [place] scheduled.  Problem list and pregnancy box updated: Yes.     Follow up 4 weeks.  Katha Cabal, DO

## 2021-02-19 NOTE — Progress Notes (Deleted)
   SUBJECTIVE:   CHIEF COMPLAINT / HPI:   Chief Complaint  Patient presents with  . Routine Prenatal Visit     Jennifer Cabrera is a 40 y.o. female here for ***   Pt reports ***    PERTINENT  PMH / PSH: reviewed and updated as appropriate   OBJECTIVE:   BP 102/60   Pulse 93   Wt 218 lb 8 oz (99.1 kg)   LMP 09/06/2020 (Approximate)   BMI 35.27 kg/m   ***  ASSESSMENT/PLAN:   No problem-specific Assessment & Plan notes found for this encounter.     Katha Cabal, DO PGY-3, [facility] 02/19/2021      {    This will disappear when note is signed, click to select method of visit    :1}

## 2021-03-20 ENCOUNTER — Other Ambulatory Visit: Payer: 59

## 2021-03-20 NOTE — Progress Notes (Signed)
      [facility] Prenatal Visit  Jennifer Cabrera is a 40 y.o. G3P2002 at [redacted]w[redacted]d here for routine follow up. She is dated by early ultrasound.  She reports no complaints. She reports fetal movement. She denies vaginal bleeding, contractions, or loss of fluid. See flow sheet for details.  There were no vitals filed for this visit.    A/P: Pregnancy at [redacted]w[redacted]d.  Doing well.   Routine prenatal care:  Dating reviewed, dating tab is correct Fetal heart tones Appropriate Fundal height within expected range.  Infant feeding choice: Both  Contraception choice: Tubal ligation, forms to be signed today Infant circumcision desired yes  The patient does not have a history of Cesarean delivery and no referral to [facility] for [facility] is indicated Influenza vaccine not administered as not influenza season.   Tdap was given today. 1 hour glucola, CBC, RPR, and HIV were obtained today.    Rh status was reviewed and patient does not need Rhogam.  Rhogam was not given today.  Childbirth and education classes were offered. Pregnancy education regarding benefits of breastfeeding, contraception, fetal growth, expected weight gain, and safe infant sleep were discussed.  Preterm labor and fetal movement precautions reviewed.  2. Pregnancy issues include the following and were addressed as appropriate today:  Iron deficiency anemia - Hgb 9.6 in April. Repeat CBC today. Continue taking iron supplements.  Pre-E ppx. Continue taking baby aspirin.  Following with MFM. Next ultrasound is scheduled for 04/17/21.  Problem list and pregnancy box updated: Yes.   Patient scheduled in [facility] during third trimester on 04/24/21.  Follow up 2 weeks with Dr. Anner Crete.  Katha Cabal, DO PGY-3, [facility]

## 2021-03-21 ENCOUNTER — Ambulatory Visit (INDEPENDENT_AMBULATORY_CARE_PROVIDER_SITE_OTHER): Payer: 59 | Admitting: Family Medicine

## 2021-03-21 ENCOUNTER — Other Ambulatory Visit: Payer: Self-pay

## 2021-03-21 VITALS — BP 110/80 | HR 84 | Wt 222.0 lb

## 2021-03-21 DIAGNOSIS — Z3491 Encounter for supervision of normal pregnancy, unspecified, first trimester: Secondary | ICD-10-CM

## 2021-03-21 DIAGNOSIS — Z23 Encounter for immunization: Secondary | ICD-10-CM | POA: Diagnosis not present

## 2021-03-21 NOTE — Patient Instructions (Addendum)
It was great seeing you today!   I'd like to see you back on 04/04/21 with Dr. Anner Crete but if you need to be seen earlier than that for any new issues we're happy to fit you in, just give [place] a call!   If you have questions or concerns please do not hesitate to call at [phone].  Dr. Katherina Right [facility]

## 2021-03-22 LAB — GLUCOSE TOLERANCE, 1 HOUR: Glucose, 1Hr PP: 93 mg/dL (ref 65–199)

## 2021-03-22 LAB — CBC
Hematocrit: 29.8 % — ABNORMAL LOW (ref 34.0–46.6)
Hemoglobin: 9.3 g/dL — ABNORMAL LOW (ref 11.1–15.9)
MCH: 24.4 pg — ABNORMAL LOW (ref 26.6–33.0)
MCHC: 31.2 g/dL — ABNORMAL LOW (ref 31.5–35.7)
MCV: 78 fL — ABNORMAL LOW (ref 79–97)
Platelets: 116 10*3/uL — ABNORMAL LOW (ref 150–450)
RBC: 3.81 x10E6/uL (ref 3.77–5.28)
RDW: 18.2 % — ABNORMAL HIGH (ref 11.7–15.4)
WBC: 4.1 10*3/uL (ref 3.4–10.8)

## 2021-03-22 LAB — RPR: RPR Ser Ql: NONREACTIVE

## 2021-03-22 LAB — HIV ANTIBODY (ROUTINE TESTING W REFLEX): HIV Screen 4th Generation wRfx: NONREACTIVE

## 2021-03-24 ENCOUNTER — Encounter: Payer: Self-pay | Admitting: Family Medicine

## 2021-03-24 NOTE — Progress Notes (Deleted)
   SUBJECTIVE:   CHIEF COMPLAINT / HPI:   Chief Complaint  Patient presents with  . Routine Prenatal Visit     Jennifer Cabrera is a 40 y.o. female here for ***   Pt reports ***    PERTINENT  PMH / PSH: reviewed and updated as appropriate   OBJECTIVE:   BP 110/80   Pulse 84   Wt 222 lb (100.7 kg)   LMP 09/06/2020 (Approximate)   BMI 35.83 kg/m   ***  ASSESSMENT/PLAN:   No problem-specific Assessment & Plan notes found for this encounter.     Katha Cabal, DO PGY-3, [facility] 03/24/2021      {    This will disappear when note is signed, click to select method of visit    :1}

## 2021-03-25 ENCOUNTER — Telehealth: Payer: Self-pay | Admitting: Family Medicine

## 2021-03-25 NOTE — Telephone Encounter (Signed)
Patient is calling and would like to check on the status of her form being completed. She left it wish Dr. Clent Ridges Friday 03/21/21. She said it was for her pregnancy.   She would like Dr. Clent Ridges to call her when it is ready to be picked up at the front desk.   The best call back is [phone].

## 2021-03-27 ENCOUNTER — Telehealth: Payer: Self-pay | Admitting: Family Medicine

## 2021-03-27 NOTE — Telephone Encounter (Addendum)
Form are in RN box ready for pick up.  She needs to complete her portion of the forms.  Thank you Dana Allan, MD Family Medicine Residency

## 2021-03-27 NOTE — Telephone Encounter (Signed)
err

## 2021-03-28 NOTE — Telephone Encounter (Signed)
Patient called and informed that forms are ready for pick up. Copy made and placed in batch scanning. Original placed at front desk for pick up.   Jeter Tomey C Corinn Stoltzfus, RN  

## 2021-04-04 ENCOUNTER — Encounter: Payer: Self-pay | Admitting: Family Medicine

## 2021-04-04 ENCOUNTER — Other Ambulatory Visit: Payer: Self-pay

## 2021-04-04 ENCOUNTER — Ambulatory Visit (INDEPENDENT_AMBULATORY_CARE_PROVIDER_SITE_OTHER): Payer: 59 | Admitting: Family Medicine

## 2021-04-04 VITALS — BP 121/70 | HR 99 | Wt 224.6 lb

## 2021-04-04 DIAGNOSIS — Z3480 Encounter for supervision of other normal pregnancy, unspecified trimester: Secondary | ICD-10-CM

## 2021-04-04 DIAGNOSIS — D509 Iron deficiency anemia, unspecified: Secondary | ICD-10-CM

## 2021-04-04 HISTORY — DX: Iron deficiency anemia, unspecified: D50.9

## 2021-04-04 NOTE — Progress Notes (Signed)
  [facility] Prenatal Visit  Jennifer Cabrera is a 40 y.o. G3P2002 at [redacted]w[redacted]d here for routine follow up. She is dated by early ultrasound.  She reports no complaints.  She reports fetal movement. She denies vaginal bleeding, contractions, or loss of fluid.  See flow sheet for details.  Vitals:   04/04/21 0912  BP: 121/70  Pulse: 99     A/P: Pregnancy at [redacted]w[redacted]d.  Doing well.   Routine prenatal care:  Dating reviewed, dating tab is correct Fetal heart tones: Appropriate Fundal height: within expected range.  The patient does not have a history of HSV and valacyclovir is not indicated. The patient does not have a history of Cesarean delivery and no referral to [facility] for [facility] is indicated Infant feeding choice: Both . Wants to breastfeed but open to formula if needed. Contraception choice: Tubal ligation, she has private insurance but forms were signed at visit on 03/21/2021 Infant circumcision desired yes Influenza vaccine not administered as patient declined, will continue to discuss.   Tdap given at last visit on 03/21/2021. COVID vaccination was discussed and she has already received 2 doses. Declines booster. Pregnancy education regarding benefits of breastfeeding, contraception, fetal growth, expected weight gain, and safe infant sleep were discussed.  Preterm labor and fetal movement precautions reviewed.   2. Pregnancy issues include the following and were addressed as appropriate today:  Iron deficiency anemia- Hgb 9.3 at last visit, which is stable from prior. Continue daily iron supplementation.  Abnormal pap- LSIL with +HPV. Recommend colpo postpartum.  Advanced maternal age. Declined genetic screening/counseling.  Fetal echogenic intracardiac focus on ultrasound- follows with MFM.   Problem list and pregnancy box updated: Yes.   Patient requesting we re-complete her form for work, as prior version did not adequately depict her desired dates of  maternity leave and her current restrictions. Advised patient to bring blank form to the office and I would happily complete.  Scheduled for Ob Faculty clinic in third trimester on 04/24/2021.    [name], MD PGY-2 [facility]

## 2021-04-04 NOTE — Patient Instructions (Addendum)
It was great to meet you! Please bring a new blank form in to our office and I will be happy to complete it for you.   Pregnancy Related Return Precautions The follow are signs/symptoms that are abnormal in pregnancy and may require further evaluation by a physician: Go to the MAU at [facility] if: You have cramping/contractions that do not go away with drinking water, especially if they are lasting 30 seconds to 1.5 minutes, coming and going every 5-10 minutes for an hour or more, or are getting stronger and you cannot walk or talk while having a contraction/cramp. Your water breaks.  Sometimes it is a big gush of fluid, sometimes it is just a trickle that keeps getting your underwear wet or running down your legs You have vaginal bleeding.    You do not feel your baby moving like normal.  If you do not, get something to eat and drink (something cold or something with sugar like peanut butter or juice) and lay down and focus on feeling your baby move. If your baby is still not moving like normal, you should go to MAU. You should feel your baby move 6 times in one hour, or 10 times in two hours. You have a persistent headache that does not go away with 1 g of Tylenol, vision changes, chest pain, difficulty breathing, severe pain in your right upper abdomen, worsening leg swelling- these can all be signs of high blood pressure in pregnancy and need to be evaluated by a provider immediately  These are all concerning in pregnancy and if you have any of these I recommend you call your PCP and present to the Maternity Admissions Unit (map below) for further evaluation.  For any pregnancy-related emergencies, please go to the Maternity Admissions Unit in the [facility] at [facility]. You will use hospital Entrance C.

## 2021-04-17 ENCOUNTER — Encounter: Payer: Self-pay | Admitting: *Deleted

## 2021-04-17 ENCOUNTER — Other Ambulatory Visit: Payer: Self-pay

## 2021-04-17 ENCOUNTER — Ambulatory Visit: Payer: 59 | Admitting: *Deleted

## 2021-04-17 ENCOUNTER — Ambulatory Visit: Payer: 59 | Attending: Obstetrics and Gynecology

## 2021-04-17 ENCOUNTER — Telehealth: Payer: Self-pay | Admitting: Family Medicine

## 2021-04-17 VITALS — BP 118/64 | HR 86

## 2021-04-17 DIAGNOSIS — O358XX0 Maternal care for other (suspected) fetal abnormality and damage, not applicable or unspecified: Secondary | ICD-10-CM | POA: Diagnosis not present

## 2021-04-17 DIAGNOSIS — E669 Obesity, unspecified: Secondary | ICD-10-CM | POA: Diagnosis not present

## 2021-04-17 DIAGNOSIS — O09523 Supervision of elderly multigravida, third trimester: Secondary | ICD-10-CM

## 2021-04-17 DIAGNOSIS — O99213 Obesity complicating pregnancy, third trimester: Secondary | ICD-10-CM

## 2021-04-17 DIAGNOSIS — O283 Abnormal ultrasonic finding on antenatal screening of mother: Secondary | ICD-10-CM | POA: Insufficient documentation

## 2021-04-17 DIAGNOSIS — Z3A32 32 weeks gestation of pregnancy: Secondary | ICD-10-CM

## 2021-04-17 NOTE — Telephone Encounter (Signed)
Short Term Disability form dropped off for at front desk for completion.  Verified that patient section of form has been completed.  Last DOS/WCC with PCP was 04/04/21.  Placed form in team folder to be completed by clinical staff.  Vilinda Blanks

## 2021-04-21 NOTE — Telephone Encounter (Signed)
Form filled out by Dr. Clent Ridges. Pt called to verify the information on the form. Pt will come and pick up the form. Form is up front to be picked up. Sunday Spillers, CMA

## 2021-04-24 ENCOUNTER — Other Ambulatory Visit: Payer: Self-pay

## 2021-04-24 ENCOUNTER — Ambulatory Visit (INDEPENDENT_AMBULATORY_CARE_PROVIDER_SITE_OTHER): Payer: 59 | Admitting: Family Medicine

## 2021-04-24 VITALS — BP 117/81 | HR 88 | Wt 222.8 lb

## 2021-04-24 DIAGNOSIS — Z3483 Encounter for supervision of other normal pregnancy, third trimester: Secondary | ICD-10-CM

## 2021-04-24 DIAGNOSIS — Z3480 Encounter for supervision of other normal pregnancy, unspecified trimester: Secondary | ICD-10-CM

## 2021-04-24 DIAGNOSIS — R87612 Low grade squamous intraepithelial lesion on cytologic smear of cervix (LGSIL): Secondary | ICD-10-CM

## 2021-04-24 NOTE — Progress Notes (Signed)
[facility] Visit  Jennifer Cabrera is a 40 y.o. G3P2002 at [redacted]w[redacted]d (via [redacted]w[redacted]d [place]) who presents to [facility] for routine follow up. Seen today along with medical student Alease Medina. Prenatal course, history, notes, ultrasounds, and laboratory results reviewed.  Denies cramping/ctx, fluid leaking, vaginal bleeding, or decreased fetal movement. Taking PNV daily, ferrous sulfate 325mg  q2 days and aspirin 81mg  daily.  Does have some pelvic pressure/discomfort from having to stand every day all day at work. Requests work note to return on Monday.  Primary Prenatal Care Provider:  Postpartum Plans: - delivery planning: SVD without epidural. Provided education on other pain relief options available during labor including nitrous oxide. - circumcision: Yes - feeding: Both breast and bottle feeding. Provided education that any amount of breast feeding will benefit baby. - pediatrician: [facility] - contraception: BTL post-partum. If BTL is delayed, [facility] is deciding between depo and OCP for an alternative contraception option.  FHR: 148 Uterine size: 33cm  Assessment & Plan  1. Routine prenatal care: -declines flu vaccine today, will think about it -encouraged to get new COVID booster, will think about it -given AMA and multip, discussed option of 39w IOL with patient, she will consider -work note given to be out tomorrow so she can rest  2. Anemia of pregnancy: - Given low iron, encouraged Kimbrely to take iron daily as tolerated. Provided education that increasing fruit and vegetable intake could help address constipation side effect of iron. - advised increasing iron to daily.   3. LSIL pap with +HPV - will need postpartum colpo  Next prenatal visit in 2 weeks. Labor & fetal movement precautions discussed.  [facility], Medical Student  Patient seen along with MS3 student Lamar Laundry. I personally evaluated this patient along with  the student, and verified all aspects of the history, physical exam, and medical decision making as documented by the student. I agree with the student's documentation and have made all necessary edits.  Alease Medina, MD  [facility]

## 2021-04-24 NOTE — Patient Instructions (Addendum)
It was nice to meet you today!  Go to the MAU at [facility] if: You have cramping/contractions that do not go away with drinking water Your water breaks.  Sometimes it is a big gush of fluid, sometimes it is just a trickle that keeps getting your underwear wet or running down your legs You have vaginal bleeding.    You do not feel your baby moving like normal.  If you do not, get something to eat and drink and lay down and focus on feeling your baby move. If your baby is still not moving like normal, you should go to MAU.   Next visit in 2 weeks  Think about COVID booster, flu shot, and whether you want to pursue induction at 39w.  Be well, Dr. Pollie Meyer   Third Trimester of Pregnancy The third trimester of pregnancy is from week 28 through week 40. This is months 7 through 9. The third trimester is a time when the unborn baby (fetus) is growing rapidly. At the end of the ninth month, the fetus is about 20 inches long and weighs 6-10 pounds. Body changes during your third trimester During the third trimester, your body will continue to go through many changes. The changes vary and generally return to normal after your baby is born. Physical changes Your weight will continue to increase. You can expect to gain 25-35 pounds (11-16 kg) by the end of the pregnancy if you begin pregnancy at a normal weight. If you are underweight, you can expect to gain 28-40 lb (about 13-18 kg), and if you are overweight, you can expect to gain 15-25 lb (about 7-11 kg). You may begin to get stretch marks on your hips, abdomen, and breasts. Your breasts will continue to grow and may hurt. A yellow fluid (colostrum) may leak from your breasts. This is the first milk you are producing for your baby. You may have changes in your hair. These can include thickening of your hair, rapid growth, and changes in texture. Some people also have hair loss during or after pregnancy, or hair that  feels dry or thin. Your belly button may stick out. You may notice more swelling in your hands, face, or ankles. Health changes You may have heartburn. You may have constipation. You may develop hemorrhoids. You may develop swollen, bulging veins (varicose veins) in your legs. You may have increased body aches in the pelvis, back, or thighs. This is due to weight gain and increased hormones that are relaxing your joints. You may have increased tingling or numbness in your hands, arms, and legs. The skin on your abdomen may also feel numb. You may feel short of breath because of your expanding uterus. Other changes You may urinate more often because the fetus is moving lower into your pelvis and pressing on your bladder. You may have more problems sleeping. This may be caused by the size of your abdomen, an increased need to urinate, and an increase in your body's metabolism. You may notice the fetus "dropping," or moving lower in your abdomen (lightening). You may have increased vaginal discharge. You may notice that you have pain around your pelvic bone as your uterus distends. Follow these instructions at home: Medicines Follow your health care provider's instructions regarding medicine use. Specific medicines may be either safe or unsafe to take during pregnancy. Do not take any medicines unless approved by your health care provider. Take a prenatal vitamin that contains at least 600  micrograms (mcg) of folic acid. Eating and drinking Eat a healthy diet that includes fresh fruits and vegetables, whole grains, good sources of protein such as meat, eggs, or tofu, and low-fat dairy products. Avoid raw meat and unpasteurized juice, milk, and cheese. These carry germs that can harm you and your baby. Eat 4 or 5 small meals rather than 3 large meals a day. You may need to take these actions to prevent or treat constipation: Drink enough fluid to keep your urine pale yellow. Eat foods that are  high in fiber, such as beans, whole grains, and fresh fruits and vegetables. Limit foods that are high in fat and processed sugars, such as fried or sweet foods. Activity Exercise only as directed by your health care provider. Most people can continue their usual exercise routine during pregnancy. Try to exercise for 30 minutes at least 5 days a week. Stop exercising if you experience contractions in the uterus. Stop exercising if you develop pain or cramping in the lower abdomen or lower back. Avoid heavy lifting. Do not exercise if it is very hot or humid or if you are at a high altitude. If you choose to, you may continue to have sex unless your health care provider tells you not to. Relieving pain and discomfort Take frequent breaks and rest with your legs raised (elevated) if you have leg cramps or low back pain. Take warm sitz baths to soothe any pain or discomfort caused by hemorrhoids. Use hemorrhoid cream if your health care provider approves. Wear a supportive bra to prevent discomfort from breast tenderness. If you develop varicose veins: Wear support hose as told by your health care provider. Elevate your feet for 15 minutes, 3-4 times a day. Limit salt in your diet. Safety Talk to your health care provider before traveling far distances. Do not use hot tubs, steam rooms, or saunas. Wear your seat belt at all times when driving or riding in a car. Talk with your health care provider if someone is verbally or physically abusive to you. Preparing for birth To prepare for the arrival of your baby: Take prenatal classes to understand, practice, and ask questions about labor and delivery. Visit the hospital and tour the maternity area. Purchase a rear-facing car seat and make sure you know how to install it in your car. Prepare the baby's room or sleeping area. Make sure to remove all pillows and stuffed animals from the baby's crib to prevent suffocation. General  instructions Avoid cat litter boxes and soil used by cats. These carry germs that can cause birth defects in the baby. If you have a cat, ask someone to clean the litter box for you. Do not douche or use tampons. Do not use scented sanitary pads. Do not use any products that contain nicotine or tobacco, such as cigarettes, e-cigarettes, and chewing tobacco. If you need help quitting, ask your health care provider. Do not use any herbal remedies, illegal drugs, or medicines that were not prescribed to you. Chemicals in these products can harm your baby. Do not drink alcohol. You will have more frequent prenatal exams during the third trimester. During a routine prenatal visit, your health care provider will do a physical exam, perform tests, and discuss your overall health. Keep all follow-up visits. This is important. Where to find more information American Pregnancy Association: americanpregnancy.org [employer] of [employer]ians [employer]necologists: https://www.todd-brady.net/ Office on [employer] Health: MightyReward.co.nz Contact a health care provider if you have: A fever. Mild pelvic cramps,  pelvic pressure, or nagging pain in your abdominal area or lower back. Vomiting or diarrhea. Bad-smelling vaginal discharge or foul-smelling urine. Pain when you urinate. A headache that does not go away when you take medicine. Visual changes or see spots in front of your eyes. Get help right away if: Your water breaks. You have regular contractions less than 5 minutes apart. You have spotting or bleeding from your vagina. You have severe abdominal pain. You have difficulty breathing. You have chest pain. You have fainting spells. You have not felt your baby move for the time period told by your health care provider. You have new or increased pain, swelling, or redness in an arm or leg. Summary The third trimester of pregnancy is from week 28 through week 40 (months 7 through  9). You may have more problems sleeping. This can be caused by the size of your abdomen, an increased need to urinate, and an increase in your body's metabolism. You will have more frequent prenatal exams during the third trimester. Keep all follow-up visits. This is important. This information is not intended to replace advice given to you by your health care provider. Make sure you discuss any questions you have with your health care provider. Document Revised: 12/27/2019 Document Reviewed: 11/02/2019 Elsevier Patient Education  2022 [employer].

## 2021-05-02 DIAGNOSIS — R87612 Low grade squamous intraepithelial lesion on cytologic smear of cervix (LGSIL): Secondary | ICD-10-CM | POA: Insufficient documentation

## 2021-05-19 ENCOUNTER — Ambulatory Visit: Payer: 59 | Admitting: Family Medicine

## 2021-05-23 ENCOUNTER — Other Ambulatory Visit: Payer: Self-pay

## 2021-05-23 ENCOUNTER — Ambulatory Visit (INDEPENDENT_AMBULATORY_CARE_PROVIDER_SITE_OTHER): Payer: 59 | Admitting: Family Medicine

## 2021-05-23 VITALS — BP 122/76 | HR 120 | Wt 228.6 lb

## 2021-05-23 DIAGNOSIS — Z23 Encounter for immunization: Secondary | ICD-10-CM | POA: Diagnosis not present

## 2021-05-23 DIAGNOSIS — Z3483 Encounter for supervision of other normal pregnancy, third trimester: Secondary | ICD-10-CM

## 2021-05-23 NOTE — Patient Instructions (Addendum)
Thank you for coming to see me today. It was a pleasure.   For any pregnancy-related emergencies after 5:30 pm please go to the Maternity Admissions Unit in the [facility] Center at [facility]. You will use hospital Entrance C.     Please follow-up with PCP in 1 week  If you have any questions or concerns, please do not hesitate to call the office at [phone].  Best,   Dana Allan, MD

## 2021-05-23 NOTE — Progress Notes (Signed)
Jennifer Cabrera is a 40 y.o. G3P2002 at [redacted]w[redacted]d for routine follow up.  See flow sheet for details.  A/P: Pregnancy at [redacted]w[redacted]d.  Doing well.   Pregnancy issues include None.   Infant feeding choice Breastfeeding Contraception choice: BTL Infant circumcision desired yes GBS collected.  Labor precautions reviewed. Kick counts reviewed.   Flu vaccine given today  Needs CG/C at next visit

## 2021-05-25 ENCOUNTER — Encounter: Payer: Self-pay | Admitting: Family Medicine

## 2021-05-27 LAB — CULTURE, BETA STREP (GROUP B ONLY): Strep Gp B Culture: NEGATIVE

## 2021-06-07 ENCOUNTER — Encounter: Payer: Self-pay | Admitting: Family Medicine

## 2021-06-09 ENCOUNTER — Ambulatory Visit (INDEPENDENT_AMBULATORY_CARE_PROVIDER_SITE_OTHER): Payer: 59 | Admitting: Family Medicine

## 2021-06-09 ENCOUNTER — Other Ambulatory Visit: Payer: Self-pay

## 2021-06-09 VITALS — BP 112/84 | HR 103 | Wt 233.0 lb

## 2021-06-09 DIAGNOSIS — Z3A40 40 weeks gestation of pregnancy: Secondary | ICD-10-CM

## 2021-06-09 DIAGNOSIS — Z3483 Encounter for supervision of other normal pregnancy, third trimester: Secondary | ICD-10-CM

## 2021-06-09 NOTE — Patient Instructions (Addendum)
For any pregnancy-related emergencies after 5:30pm please go to the Maternity Admissions Unit in the [facility] Center at [facility]. You will use hospital Entrance C.  Biophysical Profile A biophysical profile is a noninvasive test that checks the health of the developing baby (fetus) and the placenta of the pregnant person. This test may be recommended when a pregnancy is at a higher risk for certain problems. A biophysical profile is usually done during the last 3 months of pregnancy (third trimester). This procedure combines two tests. In one test, a device strapped to your belly will measure your baby's heart rate. The other test uses sound waves and a computer (ultrasound) to create an image of your baby inside your uterus. The ultrasound also measures the amount of fluid (amniotic fluid) inside your uterus. These tests tell your health care provider about the overall health of your baby. Tell a health care provider about: Any allergies you have. All medicines you are taking, including vitamins, herbs, eye drops, creams, and over-the-counter medicines. Any medical conditions you have, such as high blood pressure or diabetes. Any concerns you have about your pregnancy or any pregnancy-related complications. These may include: Abdominal pain or contractions. Nausea or vomiting. Vaginal bleeding. Leaking of amniotic fluid. Decreased fetal movements. Fever or infection. Increased swelling. Headaches. Problems with your vision. How often you feel your baby move. What are the risks? There are no risks to you or your baby from a biophysical profile. What happens before the procedure? Ask your health care provider how to prepare for this test. You may be asked to: Drink fluids so that you have a full bladder for your ultrasound. Eat before you arrive for the test. This makes your baby more active. What happens during the procedure?   You will lie on your back on an exam  table. A belt with a sensor will be placed around your belly to measure your baby's heart rate. You may have to wear another belt and sensor to measure any muscle movements (contractions) in your uterus. During the ultrasound, a health care provider or technician will put a small amount of gel on your belly and gently roll a handheld device (transducer) over it. This device sends signals to a computer that creates images of your baby. Five areas of your baby's health and development will be checked during the biophysical profile: Heart rate. Breathing. Movement. Active muscle movement (muscle tone). The amount of fluid in your uterus (amniotic fluid). The procedure may vary among health care providers and hospitals. What can I expect after the procedure? Your health care provider will discuss your results with you. The results of a biophysical profile are scored in a range of 0 to 10. The scoring is as follows: Each area that is evaluated is given a score of 0 or 2 points. If you get a score of 6 or less, you may need further testing, or your baby may need to be delivered early. A score of 8 to 10 with normal amniotic fluid levels is considered normal. Unless you need additional testing, you can go home right after the procedure and resume your normal activities. Where to find more information Office on Women's Health: http://hoffman.com/ [employer] of [employer]stetricians and Gynecologists: www.acog.org Summary A biophysical profile is a noninvasive test to check that your developing baby (fetus) and your placenta are healthy. A biophysical profile combines two tests: a test to measure your baby's heart rate and an ultrasound test to create  an image of your baby in the uterus. The ultrasound also measures the amount of amniotic fluid inside your uterus. Tell your health care provider about any concerns you have about your pregnancy or any pregnancy-related complications. If you get a  score of 6 or less, you may need further testing, or your baby may need to be delivered early. A score of 8 to 10 with normal amniotic fluid levels is considered normal. This information is not intended to replace advice given to you by your health care provider. Make sure you discuss any questions you have with your health care provider. Document Revised: 04/02/2021 Document Reviewed: 11/21/2019 Elsevier Patient Education  2022 [employer].

## 2021-06-09 NOTE — Progress Notes (Signed)
Jennifer Cabrera is a 40 y.o. G3P2002 at [redacted]w[redacted]d here for routine follow up. She is dated by early ultrasound.  She reports  lower pelvic pressure . See flow sheet for details.  A/P: Pregnancy at [redacted]w[redacted]d.  Doing well.   Dating reviewed, dating tab is correct Fetal heart tones Appropriate Fundal height within expected range.  Fetal position confirmed Vertex.  Pregnancy issues include None. Problem list updated: Yes  Infant feeding choice: Breastfeeding Contraception choice: Tubal ligation, patient does not require consent forms to be completed as she has private insurance Infant circumcision desired yes  Influenza vaccine previously administered.   Tdap previously administered between 27-36 weeks   Biophysical profile is scheduled between 40w0 day and [redacted]w[redacted]d. Induction of labor discussed with patient. Discussed plan with preceptor and Ob on call. Induction scheduled for 41+ weeks on Mon November 14.   Labor and fetal movement precautions reviewed.  Scheduled for 6 week postpartum visit for Dec 23 at 130pm

## 2021-06-11 ENCOUNTER — Ambulatory Visit: Payer: 59 | Attending: Family Medicine

## 2021-06-11 ENCOUNTER — Other Ambulatory Visit: Payer: Self-pay | Admitting: Advanced Practice Midwife

## 2021-06-11 ENCOUNTER — Other Ambulatory Visit: Payer: Self-pay

## 2021-06-11 DIAGNOSIS — O09523 Supervision of elderly multigravida, third trimester: Secondary | ICD-10-CM | POA: Diagnosis not present

## 2021-06-11 DIAGNOSIS — E669 Obesity, unspecified: Secondary | ICD-10-CM | POA: Diagnosis not present

## 2021-06-11 DIAGNOSIS — Z3483 Encounter for supervision of other normal pregnancy, third trimester: Secondary | ICD-10-CM | POA: Diagnosis present

## 2021-06-11 DIAGNOSIS — O358XX0 Maternal care for other (suspected) fetal abnormality and damage, not applicable or unspecified: Secondary | ICD-10-CM | POA: Diagnosis not present

## 2021-06-11 DIAGNOSIS — Z3A40 40 weeks gestation of pregnancy: Secondary | ICD-10-CM

## 2021-06-11 DIAGNOSIS — O99213 Obesity complicating pregnancy, third trimester: Secondary | ICD-10-CM

## 2021-06-12 ENCOUNTER — Encounter: Payer: Self-pay | Admitting: Family Medicine

## 2021-06-15 ENCOUNTER — Encounter (HOSPITAL_COMMUNITY): Payer: Self-pay | Admitting: Obstetrics & Gynecology

## 2021-06-15 ENCOUNTER — Other Ambulatory Visit: Payer: Self-pay

## 2021-06-15 ENCOUNTER — Inpatient Hospital Stay (HOSPITAL_COMMUNITY)
Admission: AD | Admit: 2021-06-15 | Discharge: 2021-06-17 | DRG: 807 | Disposition: A | Discharge status: Home / Self Care | Payer: 59 | Attending: Obstetrics & Gynecology | Admitting: Obstetrics & Gynecology

## 2021-06-15 ENCOUNTER — Inpatient Hospital Stay (HOSPITAL_COMMUNITY): Payer: 59

## 2021-06-15 DIAGNOSIS — Z880 Allergy status to penicillin: Secondary | ICD-10-CM | POA: Diagnosis not present

## 2021-06-15 DIAGNOSIS — Z20822 Contact with and (suspected) exposure to covid-19: Secondary | ICD-10-CM | POA: Diagnosis present

## 2021-06-15 DIAGNOSIS — Z7982 Long term (current) use of aspirin: Secondary | ICD-10-CM

## 2021-06-15 DIAGNOSIS — O480 Post-term pregnancy: Principal | ICD-10-CM | POA: Diagnosis present

## 2021-06-15 DIAGNOSIS — E669 Obesity, unspecified: Secondary | ICD-10-CM | POA: Diagnosis present

## 2021-06-15 DIAGNOSIS — R87612 Low grade squamous intraepithelial lesion on cytologic smear of cervix (LGSIL): Secondary | ICD-10-CM | POA: Diagnosis present

## 2021-06-15 DIAGNOSIS — O99214 Obesity complicating childbirth: Secondary | ICD-10-CM | POA: Diagnosis present

## 2021-06-15 DIAGNOSIS — D509 Iron deficiency anemia, unspecified: Secondary | ICD-10-CM | POA: Diagnosis present

## 2021-06-15 DIAGNOSIS — Z3A40 40 weeks gestation of pregnancy: Secondary | ICD-10-CM | POA: Diagnosis not present

## 2021-06-15 DIAGNOSIS — O9902 Anemia complicating childbirth: Secondary | ICD-10-CM | POA: Diagnosis present

## 2021-06-15 LAB — COMPREHENSIVE METABOLIC PANEL
ALT: 11 U/L (ref 0–44)
AST: 17 U/L (ref 15–41)
Albumin: 2.6 g/dL — ABNORMAL LOW (ref 3.5–5.0)
Alkaline Phosphatase: 110 U/L (ref 38–126)
Anion gap: 9 (ref 5–15)
BUN: 9 mg/dL (ref 6–20)
CO2: 21 mmol/L — ABNORMAL LOW (ref 22–32)
Calcium: 8.9 mg/dL (ref 8.9–10.3)
Chloride: 105 mmol/L (ref 98–111)
Creatinine, Ser: 0.62 mg/dL (ref 0.44–1.00)
GFR, Estimated: >60 mL/min (ref >60)
Glucose, Bld: 76 mg/dL (ref 70–99)
Potassium: 3.7 mmol/L (ref 3.5–5.1)
Sodium: 135 mmol/L (ref 135–145)
Total Bilirubin: 0.5 mg/dL (ref 0.3–1.2)
Total Protein: 6.2 g/dL — ABNORMAL LOW (ref 6.5–8.1)

## 2021-06-15 LAB — CBC
HCT: 35.6 % — ABNORMAL LOW (ref 36.0–46.0)
Hemoglobin: 11.1 g/dL — ABNORMAL LOW (ref 12.0–15.0)
MCH: 25.1 pg — ABNORMAL LOW (ref 26.0–34.0)
MCHC: 31.2 g/dL (ref 30.0–36.0)
MCV: 80.4 fL (ref 80.0–100.0)
Platelets: 121 10*3/uL — ABNORMAL LOW (ref 150–400)
RBC: 4.43 MIL/uL (ref 3.87–5.11)
RDW: 17.3 % — ABNORMAL HIGH (ref 11.5–15.5)
WBC: 4.6 10*3/uL (ref 4.0–10.5)
nRBC: 0 % (ref 0.0–0.2)

## 2021-06-15 LAB — RESP PANEL BY RT-PCR (FLU A&B, COVID) ARPGX2
Influenza A by PCR: NEGATIVE
Influenza B by PCR: NEGATIVE
SARS Coronavirus 2 by RT PCR: NEGATIVE

## 2021-06-15 LAB — TYPE AND SCREEN
ABO/RH(D): B POS
Antibody Screen: NEGATIVE

## 2021-06-15 MED ORDER — ACETAMINOPHEN 325 MG PO TABS: 650.0000 mg | ORAL_TABLET | ORAL | Status: DC | PRN

## 2021-06-15 MED ORDER — FENTANYL CITRATE (PF) 100 MCG/2ML IJ SOLN
100.0000 ug | INTRAMUSCULAR | Status: DC | PRN
  Administered 2021-06-16 (×4): 100 ug via INTRAVENOUS
  Filled 2021-06-15 (×4): qty 2

## 2021-06-15 MED ORDER — SOD CITRATE-CITRIC ACID 500-334 MG/5ML PO SOLN: 30.0000 mL | ORAL | Status: DC | PRN

## 2021-06-15 MED ORDER — LACTATED RINGERS IV SOLN: INTRAVENOUS | Status: DC

## 2021-06-15 MED ORDER — OXYCODONE-ACETAMINOPHEN 5-325 MG PO TABS: 2.0000 | ORAL_TABLET | ORAL | Status: DC | PRN

## 2021-06-15 MED ORDER — OXYTOCIN-SODIUM CHLORIDE 30-0.9 UT/500ML-% IV SOLN
2.5000 [IU]/h | INTRAVENOUS | Status: DC
  Administered 2021-06-16: 2.5 [IU]/h via INTRAVENOUS
  Filled 2021-06-15: qty 500

## 2021-06-15 MED ORDER — LIDOCAINE HCL (PF) 1 % IJ SOLN
30.0000 mL | INTRAMUSCULAR | Status: AC | PRN
  Administered 2021-06-16: 30 mL via SUBCUTANEOUS
  Filled 2021-06-15: qty 30

## 2021-06-15 MED ORDER — LACTATED RINGERS IV SOLN: 500.0000 mL | INTRAVENOUS | Status: DC | PRN

## 2021-06-15 MED ORDER — TERBUTALINE SULFATE 1 MG/ML IJ SOLN: 0.2500 mg | Freq: Once | INTRAMUSCULAR | Status: DC | PRN

## 2021-06-15 MED ORDER — MISOPROSTOL 25 MCG QUARTER TABLET
25.0000 ug | ORAL_TABLET | ORAL | Status: DC | PRN
  Administered 2021-06-15 – 2021-06-16 (×2): 25 ug via VAGINAL
  Filled 2021-06-15 (×3): qty 1

## 2021-06-15 MED ORDER — OXYCODONE-ACETAMINOPHEN 5-325 MG PO TABS: 1.0000 | ORAL_TABLET | ORAL | Status: DC | PRN

## 2021-06-15 MED ORDER — OXYTOCIN BOLUS FROM INFUSION
333.0000 mL | Freq: Once | INTRAVENOUS | Status: AC
  Administered 2021-06-16: 333 mL via INTRAVENOUS

## 2021-06-15 MED ORDER — ONDANSETRON HCL 4 MG/2ML IJ SOLN: 4.0000 mg | Freq: Four times a day (QID) | INTRAMUSCULAR | Status: DC | PRN

## 2021-06-15 NOTE — H&P (Addendum)
OBSTETRIC ADMISSION HISTORY AND PHYSICAL  Jennifer Cabrera is a 40 y.o. female G72P2002 with IUP at [redacted]w[redacted]d by 8 week [place] presenting for IOL for postdates. She reports +FMs, No LOF, no VB, no blurry vision, headaches or peripheral edema, and RUQ pain.  She plans on breast and formula feeding. She request BTL for birth control. She received her prenatal care at  [facility].    Dating: By 8 week [place] --->  Estimated Date of Delivery: 06/09/21  Sono: @[redacted]w[redacted]d , CWD, normal anatomy, cephalic presentation, placenta posterior, 2294g, 83% EFW  Clinic [facility] Prenatal Labs  Dating  8 wk [facility] Blood type: B/Positive/-- (04/27 1113) B positive   Genetic Screen declined Antibody:Negative (04/27 1113) negative  Anatomic 12-09-1972 Normal, fetal echogenic intracardiac focus Rubella: 1.33 (04/27 1113) Immune  GTT Early: 104   wnl            Third trimester: 93 wnl RPR: Non Reactive (04/27 1113)  Nonreactive (03/21/21)  Flu vaccine Declined HBsAg: Negative (04/27 1113)   TDaP vaccine  03/21/21                                        Rhogam: n/a HIV: Non Reactive (04/27 1113)  Negative (03/21/21)  Baby Food  Breast and formula                                       GBS: (For PCN allergy, check sensitivities)  Contraception  BTL - tubal papers not needed (not medicaid) Pap: LSIL w/ (+) HPV - recommend PP colpo   Circumcision  Yes Hemoglobinopathy: 11/27/20 negative  Pediatrician  [facility] ASA 81 mg: 11/27/20  Support Person  Prenatal Vitamins: 10/23/20    Prenatal History/Complications: 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, iron deficiency anemia, mild peripheral edema  Past Medical History: Past Medical History:  Diagnosis Date   Iron deficiency anemia 04/04/2021   Medical history non-contributory    No pertinent past medical history    Trichomoniasis    Vaginal Pap smear, abnormal     Past Surgical History: Past Surgical History:  Procedure Laterality Date   NO PAST SURGERIES       Obstetrical History: OB History     Gravida  3   Para  2   Term  2   Preterm  0   AB  0   Living  2      SAB  0   IAB  0   Ectopic  0   Multiple  0   Live Births  2        Obstetric Comments  Two prior term vaginal deliveries with no compliations.         Social History Social History   Socioeconomic History   Marital status: Single    Spouse name: Maza Tachime   Number of children: 2   Years of education: Not on file   Highest education level: 11th grade  Occupational History   Occupation: 06/04/2021   Tobacco Use   Smoking status: Never   Smokeless tobacco: Never  Vaping Use   Vaping Use: Never used  Substance and Sexual Activity   Alcohol use: No   Drug use: No   Sexual activity: Yes    Partners: Male  Birth control/protection: None  Other Topics Concern   Not on file  Social History Narrative   Not on file   Social Determinants of Health   Financial Resource Strain: Not on file  Food Insecurity: Not on file  Transportation Needs: Not on file  Physical Activity: Not on file  Stress: Not on file  Social Connections: Not on file    Family History: Family History  Problem Relation Age of Onset   Cancer Neg Hx    Drug abuse Neg Hx    Hypertension Neg Hx    Heart disease Neg Hx     Allergies: No Known Allergies  Medications Prior to Admission  Medication Sig Dispense Refill Last Dose   aspirin EC 81 MG tablet Take 1 tablet (81 mg total) by mouth daily. Swallow whole. 90 tablet 2    ferrous sulfate 325 (65 FE) MG tablet Take 325 mg by mouth daily with breakfast.      Prenatal Vit-Fe Fumarate-FA (PRENATAL MULTIVITAMIN) TABS tablet Take 1 tablet by mouth daily at 12 noon. 30 tablet 9      Review of Systems   All systems reviewed and negative except as stated in HPI  Last menstrual period 09/06/2020. General appearance: alert, cooperative, and no distress Lungs: normal work of breathing on room air Heart:  regular rate Abdomen: soft, non-tender Extremities: no signs of DVT Presentation: cephalic Fetal monitoringBaseline: 150 bpm, Variability: Good {> 6 bpm), and Accelerations: Reactive Uterine activityNone     Prenatal labs: ABO, Rh: B/Positive/-- (04/27 1113) Antibody: Negative (04/27 1113) Rubella: 1.33 (04/27 1113) RPR: Non Reactive (08/19 1057)  HBsAg: Negative (04/27 1113)  HIV: Non Reactive (08/19 1057)  GBS: Negative/-- (10/21 1523)  1 hr Glucola WNL Genetic screening  declined Anatomy US normal, fetal echogenic intracardiac focus  Prenatal Transfer Tool  Maternal Diabetes: No Genetic Screening: Normal, fetal echogenic intracardiac focus Maternal Ultrasounds/Referrals: Normal Fetal Ultrasounds or other Referrals:  None Maternal Substance Abuse:  No Significant Maternal Medications:  None Significant Maternal Lab Results: Group B Strep negative  No results found for this or any previous visit (from the past 24 hour(s)).  Patient Active Problem List   Diagnosis Date Noted   Low grade squamous intraepith lesion on cytologic smear cervix (lgsil) 05/02/2021   Iron deficiency anemia 04/04/2021   Mild peripheral edema 01/08/2021   Encounter for supervision of normal pregnancy 11/27/2020   Obesity (BMI 30-39.9) 01/25/2019   PAPANICOLAOU SMEAR, ABNORMAL 09/30/2006    Assessment/Plan:  Jennifer Cabrera is a 40 y.o. G3P2002 at [redacted]w[redacted]d here for IOL for postdates.  #Labor: Presents for IOL for postdates. Induction methods explained and questions answered with patient. Cervix at 1.5 cm. Cytotec x 1 and Foley Bulb placed.  #Pain: IV pain medicine but no epidural #FWB: Category I with moderate variability #ID:  GBS Negative #MOF: Breast and formula #MOC: BTL (private insurance) #Circ:  Yes. IP.   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 -Postpartum colposcopy  Val Eagle, Medical Student  06/15/2021, 6:00 PM  I performed  the exam and placed the foley bulb and agree with above.  Katrinka Blazing, [place], [place] 06/15/2021 8:56 PM

## 2021-06-16 ENCOUNTER — Encounter (HOSPITAL_COMMUNITY): Payer: Self-pay | Admitting: Obstetrics & Gynecology

## 2021-06-16 ENCOUNTER — Inpatient Hospital Stay (HOSPITAL_COMMUNITY): Payer: 59

## 2021-06-16 ENCOUNTER — Inpatient Hospital Stay (HOSPITAL_COMMUNITY): Admission: AD | Admit: 2021-06-16 | Payer: 59 | Source: Home / Self Care | Admitting: Obstetrics & Gynecology

## 2021-06-16 DIAGNOSIS — Z3A40 40 weeks gestation of pregnancy: Secondary | ICD-10-CM

## 2021-06-16 DIAGNOSIS — O480 Post-term pregnancy: Secondary | ICD-10-CM

## 2021-06-16 LAB — RPR: RPR Ser Ql: NONREACTIVE

## 2021-06-16 MED ORDER — BENZOCAINE-MENTHOL 20-0.5 % EX AERO: 1.0000 "application " | INHALATION_SPRAY | CUTANEOUS | Status: DC | PRN

## 2021-06-16 MED ORDER — PRENATAL MULTIVITAMIN CH
1.0000 | ORAL_TABLET | Freq: Every day | ORAL | Status: DC
  Administered 2021-06-17: 1 via ORAL
  Filled 2021-06-16: qty 1

## 2021-06-16 MED ORDER — ACETAMINOPHEN 325 MG PO TABS
650.0000 mg | ORAL_TABLET | ORAL | Status: DC | PRN
  Administered 2021-06-16: 650 mg via ORAL
  Filled 2021-06-16: qty 2

## 2021-06-16 MED ORDER — DIBUCAINE (PERIANAL) 1 % EX OINT: 1.0000 "application " | TOPICAL_OINTMENT | CUTANEOUS | Status: DC | PRN

## 2021-06-16 MED ORDER — ONDANSETRON HCL 4 MG PO TABS: 4.0000 mg | ORAL_TABLET | ORAL | Status: DC | PRN

## 2021-06-16 MED ORDER — SENNOSIDES-DOCUSATE SODIUM 8.6-50 MG PO TABS: 2.0000 | ORAL_TABLET | ORAL | Status: DC

## 2021-06-16 MED ORDER — MEASLES, MUMPS & RUBELLA VAC IJ SOLR: 0.5000 mL | Freq: Once | INTRAMUSCULAR | Status: DC

## 2021-06-16 MED ORDER — ONDANSETRON HCL 4 MG/2ML IJ SOLN: 4.0000 mg | INTRAMUSCULAR | Status: DC | PRN

## 2021-06-16 MED ORDER — SIMETHICONE 80 MG PO CHEW: 80.0000 mg | CHEWABLE_TABLET | ORAL | Status: DC | PRN

## 2021-06-16 MED ORDER — TETANUS-DIPHTH-ACELL PERTUSSIS 5-2.5-18.5 LF-MCG/0.5 IM SUSY: 0.5000 mL | PREFILLED_SYRINGE | Freq: Once | INTRAMUSCULAR | Status: DC

## 2021-06-16 MED ORDER — WITCH HAZEL-GLYCERIN EX PADS: 1.0000 "application " | MEDICATED_PAD | CUTANEOUS | Status: DC | PRN

## 2021-06-16 MED ORDER — DIPHENHYDRAMINE HCL 25 MG PO CAPS: 25.0000 mg | ORAL_CAPSULE | Freq: Four times a day (QID) | ORAL | Status: DC | PRN

## 2021-06-16 MED ORDER — IBUPROFEN 600 MG PO TABS
600.0000 mg | ORAL_TABLET | Freq: Four times a day (QID) | ORAL | Status: DC
  Administered 2021-06-16 – 2021-06-17 (×4): 600 mg via ORAL
  Filled 2021-06-16 (×4): qty 1

## 2021-06-16 MED ORDER — COCONUT OIL OIL: 1.0000 "application " | TOPICAL_OIL | Status: DC | PRN

## 2021-06-16 NOTE — Lactation Note (Signed)
This note was copied from a baby's chart. Lactation Consultation Note  Patient Name: Jennifer Cabrera [id number] Date: 06/16/2021 Reason for consult: L&D Initial assessment;1st time breastfeeding Age:39 hours  P3, Mother states she would like to try breastfeeding for the first time.  Baby cueing.  Assisted with latching with ease.  Lactation to follow up on MBU.   Maternal Data Does the patient have breastfeeding experience prior to this delivery?: No  LATCH Score Latch: Grasps breast easily, tongue down, lips flanged, rhythmical sucking.  Audible Swallowing: A few with stimulation  Type of Nipple: Everted at rest and after stimulation  Comfort (Breast/Nipple): Soft / non-tender  Hold (Positioning): Assistance needed to correctly position infant at breast and maintain latch.  LATCH Score: 8   Interventions Interventions: Assisted with latch;Skin to skin;Education  Consult Status Consult Status: Follow-up from L&D    Dahlia Byes [facility] 06/16/2021, 10:58 AM

## 2021-06-16 NOTE — Progress Notes (Addendum)
Labor Progress Note Jennifer Cabrera is a 40 y.o. G3P2002 at [redacted]w[redacted]d who presented for IOL for postdates.   S: Breathing through contractions. No concerns at this time.   O:  BP 127/84   Pulse (!) 103   Temp 98.6 F (37 C) (Oral)   Resp 16   LMP 09/06/2020 (Approximate)   EFM: Baseline 135 bpm, moderate variability, + accels, no decels Toco: Every 2-3 minutes   CVE: Dilation: 6.5 Effacement (%): 80 Station: -3 Presentation: Vertex Exam by:: Dr.Jobe Mutch   A&P: 40 y.o. [id number] [redacted]w[redacted]d   #Labor: Progressing well. AROM performed this check with clear fluid. Mom and baby tolerated this well. Contracting regularly on her own. Will expectantly manage for now and augment with Pitocin as needed.  #Pain: Maternal support, IV Fentanyl PRN #FWB: Cat 1  #GBS negative  Worthy Rancher, MD 6:38 AM

## 2021-06-16 NOTE — Plan of Care (Signed)

## 2021-06-16 NOTE — Progress Notes (Signed)
Labor Progress Note Jennifer Cabrera is a 40 y.o. G3P2002 at [redacted]w[redacted]d who presented for IOL for postdates.   S: Doing well. No concerns at this time. Partner at bedside.   O:  BP 125/85   Pulse 91   Temp 98.6 F (37 C) (Oral)   Resp 16   LMP 09/06/2020 (Approximate)   EFM: Baseline 135 bpm, moderate variability, no accels, no decels   CVE: Dilation: 1.5 Effacement (%): 50 Station: Ballotable Exam by:: CO'Rear   A&P: 40 y.o. [id number] [redacted]w[redacted]d   #Labor: Foley balloon remains in place. Will continue Cytotec and reassess once FB dislodges.  #Pain: PRN, wanting to avoid epidural, IV pain medicine for now  #FWB: Cat 1  #GBS negative  Worthy Rancher, MD 1:51 AM

## 2021-06-16 NOTE — Progress Notes (Signed)
    [facility]N Attending Postpartum Sterilization Counseling Note  40 y.o. [id number] s/p recent vaginal delivery at [redacted]w[redacted]d who desires permanent sterilization. Met with patient and her husband on Mother-Baby.  Had a long discussion about other reversible forms of contraception including the most effective LARCs such as IUD or Nexplanon were discussed with patient; she is considering using IUD instead. Also talked about vasectomy, they are considering this too after they were counseled about this procedure being less invasive and more effective. Details of postpartum tubal sterilization discussed in detail.  She was told that this will be performed as either salpingectomy or occlusion with Filshie clips, depending on difficulty of procedure, exposure and other factors.  Risks of procedure discussed with patient including but not limited to: risk of regret, permanence of method, bleeding, infection, injury to surrounding organs and need for additional procedures.  Failure risk of about 1-2% with increased risk of ectopic gestation if pregnancy occurs was also discussed with patient.   Also discussed possibility of post-tubal syndrome with increased pelvic pain or menstrual irregularities.  Patient was also given the option of an interval laparoscopic tubal ligation or bilateral salpingectomy which slightly increases the efficacy and is less invasive.  Patient verbalized understanding of these risks and all questions were answered.  She is unsure if she wants to proceed with sterilization today, wants to consider all options.  She may decide to have the procedure tomorrow or later.  Consent was not signed for now.   Will continue routine postpartum care for now.   Verita Schneiders, MD, [place] [facility] [employer], [place]

## 2021-06-16 NOTE — Discharge Summary (Signed)
Postpartum Discharge Summary     Patient Name: Jennifer Cabrera DOB: 07-28-1981 MRN: [id number]  Date of admission: 06/15/2021 Delivery date:06/16/2021  Delivering provider: Julianne Handler  Date of discharge: 06/17/2021  Admitting diagnosis: Post term pregnancy over 40 weeks [O48.0] Intrauterine pregnancy: [redacted]w[redacted]d    Secondary diagnosis:  Principal Problem:   SVD (spontaneous vaginal delivery) Active Problems:   Obesity (BMI 30-39.9)   Iron deficiency anemia   Low grade squamous intraepith lesion on cytologic smear cervix (lgsil)   Post term pregnancy over 40 weeks  Additional problems: None    Discharge diagnosis: Term Pregnancy Delivered                                              Post partum procedures: None Augmentation: AROM, Cytotec, and IP Foley Complications: None  Hospital course: Induction of Labor With Vaginal Delivery   40y.o. yo G3P2002 at 473w0das admitted to the hospital 06/15/2021 for induction of labor.  Indication for induction: Postdates.  Patient had an uncomplicated labor course as follows: Membrane Rupture Time/Date: 6:30 AM ,06/16/2021   Delivery Method:Vaginal, Spontaneous  Episiotomy: None  Lacerations:  1st degree  Details of delivery can be found in separate delivery note.  Patient had a routine postpartum course and is meeting all milestones. Patient is discharged home 06/17/21.  Newborn Data: Birth date:06/16/2021  Birth time:10:02 AM  Gender:Female  Living status:Living  Apgars:8 ,9  Weight:4220 g   Magnesium Sulfate received: No BMZ received: No Rhophylac:N/A MMR:N/A T-DaP:Given prenatally Flu: No Transfusion:No  Physical exam  Vitals:   06/16/21 1312 06/16/21 1645 06/16/21 1945 06/17/21 0511  BP: 126/86 127/81 129/84 115/81  Pulse: 92 (!) 103 (!) 105 94  Resp: '20 20 16 18  ' Temp: 98.2 F (36.8 C) 99.4 F (37.4 C) 98.6 F (37 C) 98.2 F (36.8 C)  TempSrc: Axillary Axillary Axillary Axillary  SpO2: 100% 99%  100%  Weight:       Height:       General: alert, cooperative, and no distress Lochia: appropriate Uterine Fundus: firm and below umbilicus  DVT Evaluation: no LE edema or calf tenderness to palpation   Labs: Lab Results  Component Value Date   WBC 4.6 06/15/2021   HGB 11.1 (L) 06/15/2021   HCT 35.6 (L) 06/15/2021   MCV 80.4 06/15/2021   PLT 121 (L) 06/15/2021   CMP Latest Ref Rng & Units 06/15/2021  Glucose 70 - 99 mg/dL 76  BUN 6 - 20 mg/dL 9  Creatinine 0.44 - 1.00 mg/dL 0.62  Sodium 135 - 145 mmol/L 135  Potassium 3.5 - 5.1 mmol/L 3.7  Chloride 98 - 111 mmol/L 105  CO2 22 - 32 mmol/L 21(L)  Calcium 8.9 - 10.3 mg/dL 8.9  Total Protein 6.5 - 8.1 g/dL 6.2(L)  Total Bilirubin 0.3 - 1.2 mg/dL 0.5  Alkaline Phos 38 - 126 U/L 110  AST 15 - 41 U/L 17  ALT 0 - 44 U/L 11   Edinburgh Score: No flowsheet data found.   After visit meds:  Allergies as of 06/17/2021   No Known Allergies      Medication List     STOP taking these medications    aspirin EC 81 MG tablet       TAKE these medications    acetaminophen 500 MG tablet Commonly known as: TYLENOL  Take 2 tablets (1,000 mg total) by mouth every 8 (eight) hours as needed (pain).   ferrous sulfate 325 (65 FE) MG tablet Take 325 mg by mouth daily with breakfast.   ibuprofen 600 MG tablet Commonly known as: ADVIL Take 1 tablet (600 mg total) by mouth every 6 (six) hours as needed (pain).   prenatal multivitamin Tabs tablet Take 1 tablet by mouth daily at 12 noon.         Discharge home in stable condition Infant Feeding: Bottle and Breast Infant Disposition:home with mother Discharge instruction: per After Visit Summary and Postpartum booklet. Activity: Advance as tolerated. Pelvic rest for 6 weeks.  Diet: routine diet Future Appointments: Future Appointments  Date Time Provider [place]  06/18/2021  2:50 PM Carollee Leitz, MD [facility]  07/25/2021  1:30 PM Carollee Leitz, MD [facility]   07/30/2021 10:35 AM Woodroe Mode, MD [facility]   Follow up Visit: Please schedule this patient for a In person postpartum visit in 6 weeks with the following provider: Any provider. Additional Postpartum F/U:  Needs colpo postpartum   Low risk pregnancy complicated by:  None Delivery mode:  Vaginal, Spontaneous  Anticipated Birth Control:  IUD outpatient    06/17/2021 Genia Del, MD

## 2021-06-17 MED ORDER — IBUPROFEN 600 MG PO TABS: 600.0000 mg | ORAL_TABLET | Freq: Four times a day (QID) | ORAL | 0 refills | Status: AC | PRN

## 2021-06-17 MED ORDER — ACETAMINOPHEN 500 MG PO TABS
1000.0000 mg | ORAL_TABLET | Freq: Three times a day (TID) | ORAL | 0 refills | Status: DC | PRN
Start: 2021-06-17 — End: 2021-08-07

## 2021-06-18 ENCOUNTER — Encounter: Payer: 59 | Admitting: Family Medicine

## 2021-06-27 ENCOUNTER — Telehealth (HOSPITAL_COMMUNITY): Payer: Self-pay | Admitting: *Deleted

## 2021-06-27 NOTE — Telephone Encounter (Signed)
Phone voicemail message left to return nurse call.  Duffy Rhody, RN 06-27-2021 at 9:50am

## 2021-06-30 ENCOUNTER — Telehealth (HOSPITAL_COMMUNITY): Payer: Self-pay | Admitting: *Deleted

## 2021-06-30 NOTE — Telephone Encounter (Signed)
Mom reports feeling good. No concerns about herself at this time. EPDS=0 [facility] score=0 ) Mom reports baby is doing well. Feeding, peeing, and pooping without difficulty. Safe sleep reviewed. Mom reports no concerns about baby at present.  Duffy Rhody, RN 06-30-2021 at 2:10pm

## 2021-07-22 ENCOUNTER — Ambulatory Visit (INDEPENDENT_AMBULATORY_CARE_PROVIDER_SITE_OTHER): Payer: 59 | Admitting: Family Medicine

## 2021-07-22 ENCOUNTER — Encounter: Payer: 59 | Admitting: Family Medicine

## 2021-07-22 ENCOUNTER — Other Ambulatory Visit: Payer: Self-pay

## 2021-07-22 VITALS — BP 106/72 | HR 90 | Wt 210.0 lb

## 2021-07-22 DIAGNOSIS — Z3009 Encounter for other general counseling and advice on contraception: Secondary | ICD-10-CM | POA: Diagnosis not present

## 2021-07-22 NOTE — Patient Instructions (Signed)
It was great seeing you today!  You were seen for a post partum visit and to discuss IUD placement. I am glad to see you are doing well! I have provided a note for work as well.   Please check-out at the front desk before leaving the clinic to schedule the next available time for your IUD insertion  Feel free to call with any questions or concerns at any time, at [phone].   Take care,  Dr. Cora Collum [facility]   Levonorgestrel Intrauterine Device What is this medication? LEVONORGESTREL (LEE voe nor jes trel) prevents ovulation and pregnancy. It may also be used to treat heavy periods. It belongs to a group of medications called contraceptives. This medication is a progestin hormone. This medicine may be used for other purposes; ask your health care provider or pharmacist if you have questions. COMMON BRAND NAME(S): Cameron Ali What should I tell my care team before I take this medication? They need to know if you have any of these conditions: Abnormal Pap smear Cancer of the breast, uterus, or cervix Diabetes Endometritis Genital or pelvic infection now or in the past Have more than one sexual partner or your partner has more than one partner Heart disease History of an ectopic or tubal pregnancy Immune system problems IUD in place Liver disease or tumor Problems with blood clots or take blood-thinners Seizures Use intravenous drugs Uterus of unusual shape Vaginal bleeding that has not been explained An unusual or allergic reaction to levonorgestrel, other hormones, silicone, or polyethylene, medications, foods, dyes, or preservatives Pregnant or trying to get pregnant Breast-feeding How should I use this medication? This device is placed inside the uterus by your care team. A patient package insert for the product will be given each time it is inserted. Be sure to read this information carefully each time. The sheet may  change often. Talk to your care team about use of this medication in children. Special care may be needed. Overdosage: If you think you have taken too much of this medicine contact a poison control center or emergency room at once. NOTE: This medicine is only for you. Do not share this medicine with others. What if I miss a dose? This does not apply. Depending on the brand of device you have inserted, the device will need to be replaced every 3 to 8 years if you wish to continue using this type of birth control. What may interact with this medication? Interactions are not expected. Tell your care team about all the medications you take. This list may not describe all possible interactions. Give your health care provider a list of all the medicines, herbs, non-prescription drugs, or dietary supplements you use. Also tell them if you smoke, drink alcohol, or use illegal drugs. Some items may interact with your medicine. What should I watch for while using this medication? Visit your care team for regular check-ups. Tell your care team if you or your partner becomes HIV positive or gets a sexually transmitted disease. Using this medication does not protect you or your partner against HIV or other sexually transmitted infections (STIs). You can check the placement of the IUD yourself by reaching up to the top of your vagina with clean fingers to feel the threads. Do not pull on the threads. It is a good habit to check placement after each menstrual period. Call your care team right away if you feel more of the IUD than just the  threads or if you cannot feel the threads at all. The IUD may come out by itself. You may become pregnant if the device comes out. If you notice that the IUD has come out use a backup birth control method like condoms and call your care team. Using tampons will not change the position of the IUD and are okay to use during your period. This IUD can be safely scanned with magnetic  resonance imaging (MRI) only under specific conditions. Before you have an MRI, tell your care team that you have an IUD in place, and which type of IUD you have in place. What side effects may I notice from receiving this medication? Side effects that you should report to your care team as soon as possible: Allergic reactions--skin rash, itching, hives, swelling of the face, lips, tongue, or throat Blood clot--pain, swelling, or warmth in the leg, shortness of breath, chest pain Gallbladder problems--severe stomach pain, nausea, vomiting, fever Increase in blood pressure Liver injury--right upper belly pain, loss of appetite, nausea, light-colored stool, dark yellow or brown urine, yellowing skin or eyes, unusual weakness or fatigue New or worsening migraines or headaches Pelvic inflammatory disease (PID)--fever, abdominal pain, pelvic pain, pain or trouble passing urine, spotting, bleeding during or after sex Stroke--sudden numbness or weakness of the face, arm, or leg, trouble speaking, confusion, trouble walking, loss of balance or coordination, dizziness, severe headache, change in vision Unusual vaginal discharge, itching, or odor Vaginal pain, irritation, or sores Worsening mood, feelings of depression Side effects that usually do not require medical attention (report to your care team if they continue or are bothersome): Breast pain or tenderness Dark patches of skin on the face or other sun-exposed areas Irregular menstrual cycles or spotting Nausea Weight gain This list may not describe all possible side effects. Call your doctor for medical advice about side effects. You may report side effects to FDA at 1-800-FDA-1088. Where should I keep my medication? This does not apply. NOTE: This sheet is a summary. It may not cover all possible information. If you have questions about this medicine, talk to your doctor, pharmacist, or health care provider.  2022 Elsevier/Gold Standard  (2021-04-02 00:00:00)

## 2021-07-22 NOTE — Progress Notes (Signed)
° ° °  SUBJECTIVE:   CHIEF COMPLAINT / HPI:   Ms. Carel is a 40 yo who presents for her post partum visit. She delivered on 11/14 after an IOL for post dates and had an uncomplicated pregnancy and labor course. She endorses feeling well and without any pain or bleeding. Feels well emotionally and feels well supported.   Wants to get IUD placed and wants to know about the process of IUD insertion and side effects.   Requests a note so that she can go back to work on 1/6    OBJECTIVE:   BP 106/72    Pulse 90    Wt 210 lb (95.3 kg)    SpO2 99%    Breastfeeding No    BMI 33.89 kg/m    Physical exam General: well appearing, NAD Cardiovascular: RRR, no murmurs Lungs: CTAB. Normal WOB Abdomen: soft, non-distended Skin: warm, dry. No visible rashes or lesions   ASSESSMENT/PLAN:   No problem-specific Assessment & Plan notes found for this encounter.   Post partum visit Patient with uncomplicated pregnancy and delivery presents for follow up. Currently without pain or bleeding. Edinburgh score of 0. Feels well supported. Discussed IUD for contraception including how the procedure will go and potential side effects. Patient scheduled appointment to IUD insertion after leaving appointment. Denied needing any oral contraception at this time. Feels well to return to work on 1/6. Provided letter stating she could return at that time.    Cora Collum, DO [facility]

## 2021-07-24 ENCOUNTER — Ambulatory Visit: Payer: 59

## 2021-07-25 ENCOUNTER — Encounter: Payer: 59 | Admitting: Family Medicine

## 2021-07-30 ENCOUNTER — Ambulatory Visit: Payer: 59 | Admitting: Obstetrics & Gynecology

## 2021-08-07 ENCOUNTER — Other Ambulatory Visit: Payer: Self-pay

## 2021-08-07 ENCOUNTER — Ambulatory Visit (INDEPENDENT_AMBULATORY_CARE_PROVIDER_SITE_OTHER): Payer: 59 | Admitting: Family Medicine

## 2021-08-07 ENCOUNTER — Telehealth: Payer: Self-pay | Admitting: Family Medicine

## 2021-08-07 VITALS — BP 114/84 | HR 86 | Ht 66.0 in | Wt 215.8 lb

## 2021-08-07 DIAGNOSIS — N898 Other specified noninflammatory disorders of vagina: Secondary | ICD-10-CM

## 2021-08-07 DIAGNOSIS — Z975 Presence of (intrauterine) contraceptive device: Secondary | ICD-10-CM | POA: Diagnosis not present

## 2021-08-07 LAB — POCT WET PREP (WET MOUNT)
Clue Cells Wet Prep Whiff POC: NEGATIVE
Trichomonas Wet Prep HPF POC: ABSENT

## 2021-08-07 LAB — POCT URINE PREGNANCY: Preg Test, Ur: NEGATIVE

## 2021-08-07 MED ORDER — ACETAMINOPHEN 500 MG PO TABS
500.0000 mg | ORAL_TABLET | Freq: Once | ORAL | Status: AC
  Administered 2021-08-07: 500 mg via ORAL

## 2021-08-07 MED ORDER — ACETAMINOPHEN 325 MG PO TABS: 650.0000 mg | ORAL_TABLET | Freq: Once | ORAL | Status: DC

## 2021-08-07 NOTE — Telephone Encounter (Signed)
HIPAA compliant callback message left.   Please advise her that her Wet prep is negative for yeast, BV or trich.

## 2021-08-07 NOTE — Patient Instructions (Signed)
It was nice seeing you today. We completed IUD placement with some bleeding at the end. Bleeding should not last longer than 24 hours. If bleeding persists with cramping, please give [place] a call or go to the Emergency department. You are due for colposcopic examination due to abnormal PAP from 2022. Please schedule colposcopy appointment in the next few weeks.  IUD PLACEMENT POST-PROCEDURE INSTRUCTIONS  You may take Ibuprofen, Aleve or Tylenol for pain if needed.  Cramping should resolve within in 24 hours.  You may have a small amount of spotting.  You should wear a mini pad for the next few days.  You may have intercourse after 24 hours.  If you using this for birth control, it is effective immediately.  You need to call if you have any pelvic pain, fever, heavy bleeding or foul smelling vaginal discharge.  Irregular bleeding is common the first several months after having an IUD placed. You do not need to call for this reason unless you are concerned.  Shower or bathe as normal  You should have a follow-up appointment in 4-8 weeks for a re-check to make sure you are not having any problems.

## 2021-08-07 NOTE — Telephone Encounter (Signed)
Patient returns call to nurse line.   Results given to patient. 

## 2021-08-07 NOTE — Progress Notes (Signed)
IUD Insertion Procedure Note  Pre-operative Diagnosis: Contraceptive management  Post-operative Diagnosis: same  Indications: contraception  Procedure Details  Urine pregnancy test was done on 08/07/21 and result was negative.  The risks (including infection, bleeding, pain, and uterine perforation) and benefits of the procedure were explained to the patient and Written informed consent was obtained.    Cervix cleansed with Betadine. Uterus sounded to 9 cm. IUD inserted without difficulty. String visible and trimmed. Patient tolerated procedure well.  IUD Information: Mirena. Lot#:[id number] Exp: 2025/Feb  Condition: Stable  Complications: Post op bleeding  Plan:  The patient was advised to call for any fever or for prolonged or severe pain or bleeding. She was advised to use OTC acetaminophen as needed for mild to moderate pain. Tylenol was provided after her procedure today for pain.  LSIL - I advised her to return to colpo clinic in the next 1-2 weeks for colposcopy given her abnormal PAP from 4/22. She agreed with the plan.   Attending Physician Documentation: I was present for or participated in the entire procedure, including opening and closing.

## 2021-09-04 MED ORDER — LEVONORGESTREL 20 MCG/DAY IU IUD
1.0000 | INTRAUTERINE_SYSTEM | Freq: Once | INTRAUTERINE | Status: AC
  Administered 2021-08-07: 1 via INTRAUTERINE

## 2021-09-04 NOTE — Addendum Note (Signed)
Addended by: Veronda Prude on: 09/04/2021 11:26 AM   Modules accepted: Orders

## 2021-09-22 ENCOUNTER — Ambulatory Visit: Payer: 59 | Admitting: Family Medicine

## 2021-09-22 NOTE — Progress Notes (Deleted)
No Show

## 2021-09-25 ENCOUNTER — Ambulatory Visit: Payer: 59

## 2022-01-06 ENCOUNTER — Encounter: Payer: Self-pay | Admitting: *Deleted

## 2023-06-18 ENCOUNTER — Encounter: Payer: Self-pay | Admitting: Family Medicine

## 2023-06-18 ENCOUNTER — Ambulatory Visit (INDEPENDENT_AMBULATORY_CARE_PROVIDER_SITE_OTHER): Payer: BC Managed Care – PPO | Admitting: Family Medicine

## 2023-06-18 VITALS — BP 135/87 | HR 76 | Ht 66.0 in | Wt 208.0 lb

## 2023-06-18 DIAGNOSIS — D509 Iron deficiency anemia, unspecified: Secondary | ICD-10-CM | POA: Diagnosis not present

## 2023-06-18 DIAGNOSIS — R2681 Unsteadiness on feet: Secondary | ICD-10-CM | POA: Diagnosis not present

## 2023-06-18 DIAGNOSIS — T7840XA Allergy, unspecified, initial encounter: Secondary | ICD-10-CM

## 2023-06-18 LAB — POCT HEMOGLOBIN: Hemoglobin: 13.8 g/dL (ref 11–14.6)

## 2023-06-18 MED ORDER — CETIRIZINE HCL 10 MG PO TABS: 10.0000 mg | ORAL_TABLET | Freq: Every day | ORAL | 0 refills | Status: DC

## 2023-06-18 NOTE — Patient Instructions (Addendum)
It was wonderful to see you today.  Please bring ALL of your medications with you to every visit.   Today we talked about:  Your feeling of drifting to one side. The good news is your blood level was normal (hemoglobin). Your other testing did not indicate a neurological cause. Given your congestion and sinuses, I think your allergies could be contributing to this.  Please take the allergy medicine I sent you everyday. I would like to see you in one month for follow up.   Thank you for choosing [facility].   Please call [phone] with any questions about today's appointment.  Please arrive at least 15 minutes prior to your scheduled appointments.   If you had blood work today, I will send you a MyChart message or a letter if results are normal. Otherwise, I will give you a call.   If you had a referral placed, they will call you to set up an appointment. Please give [place] a call if you don't hear back in the next 2 weeks.   If you need additional refills before your next appointment, please call your pharmacy first.   Hal Morales, MD Family Medicine

## 2023-06-18 NOTE — Progress Notes (Cosign Needed Addendum)
    SUBJECTIVE:   CHIEF COMPLAINT / HPI:  For the last five months patient has been feeling like she may drift to one side when she is walking. Denies any dizziness, headaches, LOC, chest pain, SOB or fatigue. No trauma to head. This happens when she is at work randomly when she walks. She does not have any tinnitus or room spinning sensation. She was at the dentist and they took xrays and told her she had fluid in her sinuses. 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   She has a history of iron deficiency anemia when she was pregnant and she takes her iron pills daily. She has an IUD but gets period every month. 2-3 days, dark blood but not heavy.   PERTINENT  PMH / PSH:  IDA w/ pregnancy   OBJECTIVE:   BP 135/87   Pulse 76   Ht 5\' 6"  (1.676 m)   Wt 208 lb (94.3 kg)   SpO2 100%   BMI 33.57 kg/m   General: A&O, NAD HEENT: No sign of trauma, EOM grossly intact. Bilateral turbinates swollen. Bilateral TM w/o erythema or fluid.  Cardiac: RRR, no m/r/g Respiratory: CTAB, normal WOB, no w/c/r GI: Soft, NTTP, non-distended  Extremities: NTTP, no peripheral edema. Neuro: MENTAL STATUS: AAOx3, memory intact, fund of knowledge appropriate CRANIAL NERVES: II: Pupils equal and reactive, no RAPD, no VF deficits III, IV, VI: EOM intact, no gaze preference or deviation, no nystagmus. V: normal sensation in V1, V2, and V3 segments bilaterally VII: no asymmetry, no nasolabial fold flattening VIII: normal hearing to speech IX, X: normal palatal elevation, no uvular deviation XI: 5/5 head turn and 5/5 shoulder shrug bilaterally XII: midline tongue protrusion MOTOR: 5/5 muscle power in Rt shoulder abductors/adductors, elbow flexors/extensors, wrist flexors/extensors, finger abductors/adductors.  5/5 in Rt hipflexors/extensors, knee flexors/extensors, ankle dorsiflexors and planter flexors.  5/5 muscle power in Lt  shoulder abductors/adductors, elbow flexors/extensors, wrist flexors/extensors, finger abductors/adductors.  5/5 in Lt hipflexors/extensors, knee flexors/extensors, ankle dorsiflexors and planter flexors.  SENSORY: Normal to touch, pinprick, vibration, temp all limbs No hemineglect Romberg absent COORD: Normal finger to nose STATION: normal stance, no truncal ataxia GAIT: Normal; patient able to tip-toe, heel-walk.   ASSESSMENT/PLAN:   Gait instability Patient presents with feeling of drifting to the left side for 5 months. No prior contributing history. Has history of iron deficiency anemia. Sxs do not align with iron deficiency. Hg today wnl. Negative rhomberg sign, normal gait. Dix- hallpike negative. Given reassuring neurological findings and normal Hg along with swollen turbinates and history of sinus fullness, likely allergic in nature. Will trial zyrtec for 1 month and follow up.    Hal Morales, MD [facility]

## 2023-06-18 NOTE — Assessment & Plan Note (Addendum)
Patient presents with feeling of drifting to the left side for 5 months. No prior contributing history. Has history of iron deficiency anemia. Sxs do not align with iron deficiency. Hg today wnl. Negative rhomberg sign, normal gait. Dix- hallpike negative. Given reassuring neurological findings and normal Hg along with swollen turbinates and history of sinus fullness, likely allergic in nature. Will trial zyrtec for 1 month and follow up.

## 2023-06-24 ENCOUNTER — Encounter: Payer: Self-pay | Admitting: Family Medicine

## 2023-07-13 ENCOUNTER — Other Ambulatory Visit: Payer: Self-pay | Admitting: Family Medicine

## 2023-07-13 DIAGNOSIS — T7840XA Allergy, unspecified, initial encounter: Secondary | ICD-10-CM

## 2023-09-06 IMAGING — US US MFM OB FOLLOW-UP
1 series · 14 of 19 positions shown · non-contrast
Comparison: none

[Series 1: us mfm ob follow-up · 14 of 19 slices shown]
[im 1/19]
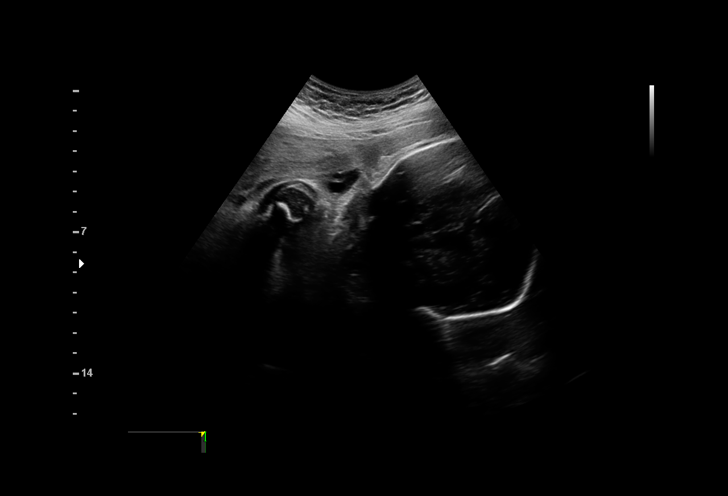
[im 3/19]
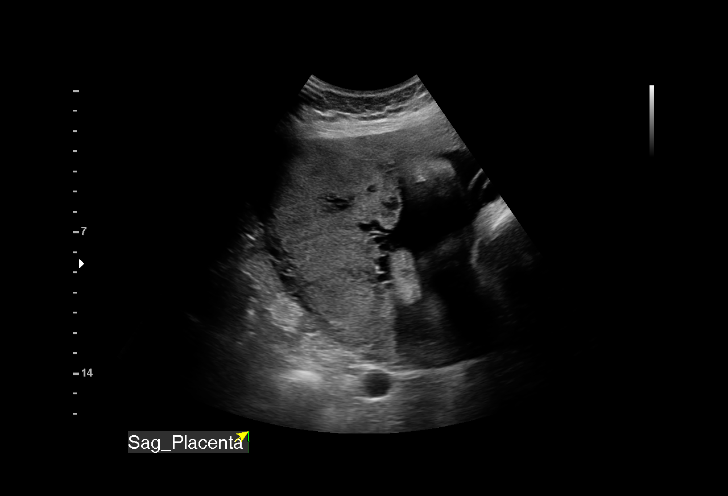
[im 4/19]
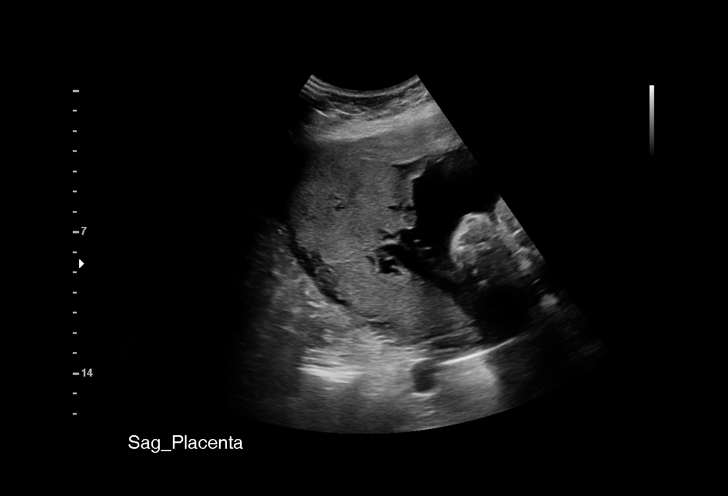
[im 5/19]
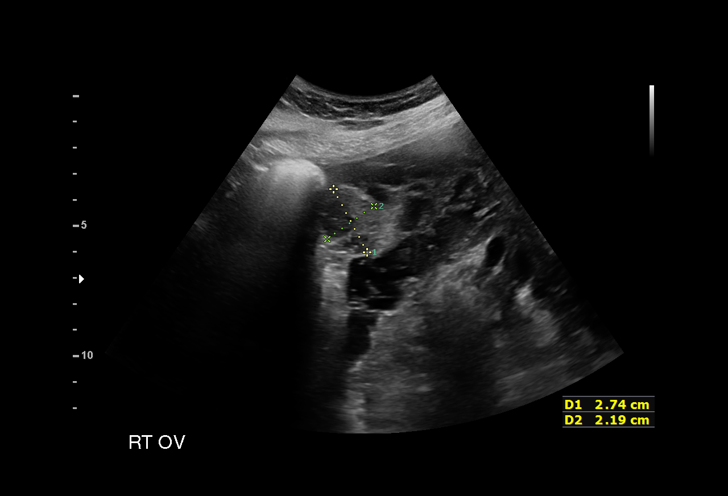
[im 7/19]
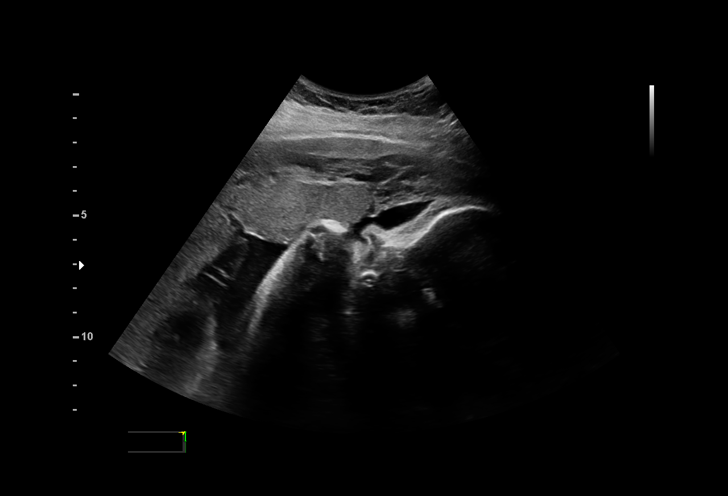
[im 8/19]
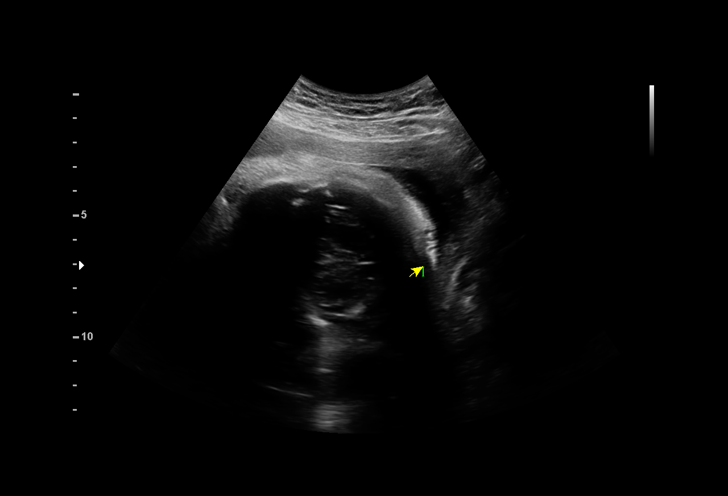
[im 9/19]
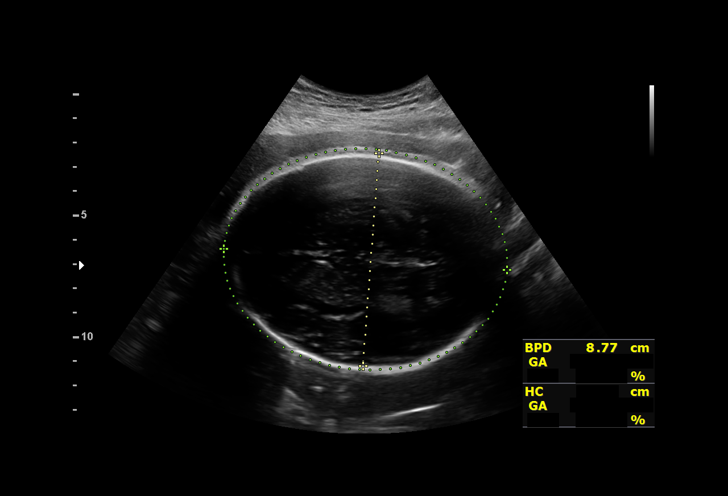
[im 11/19]
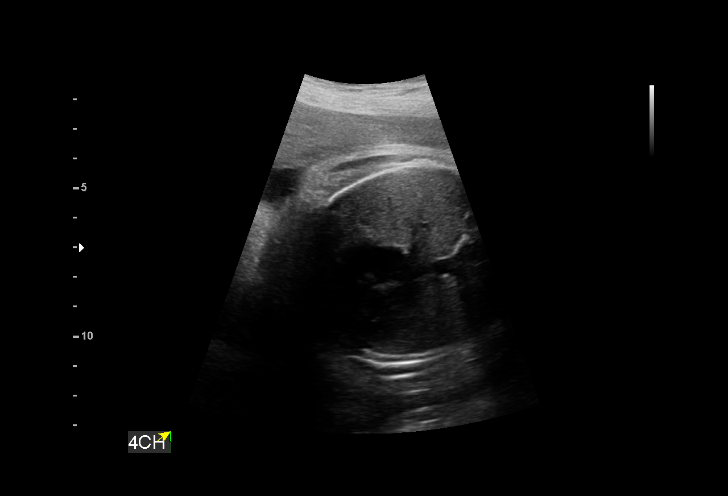
[im 12/19]
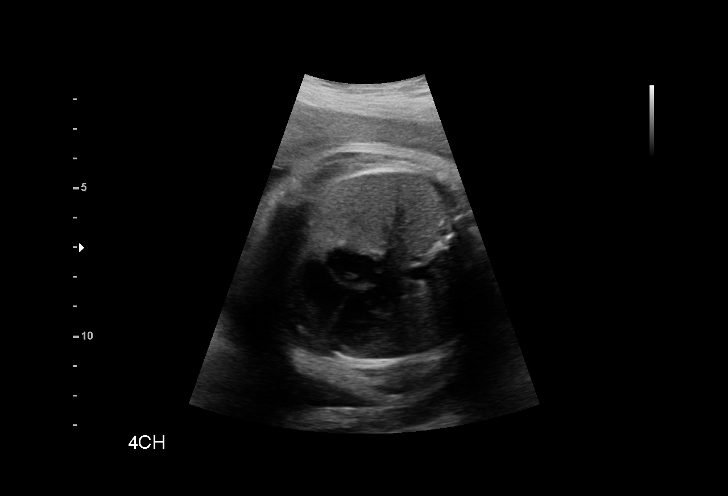
[im 13/19]
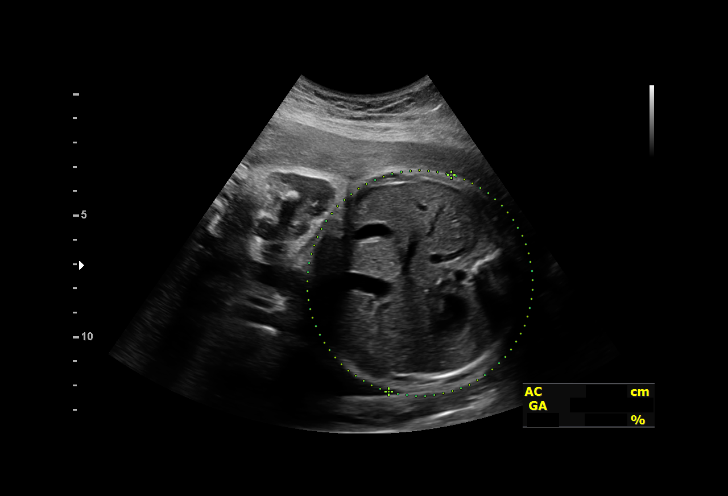
[im 15/19]
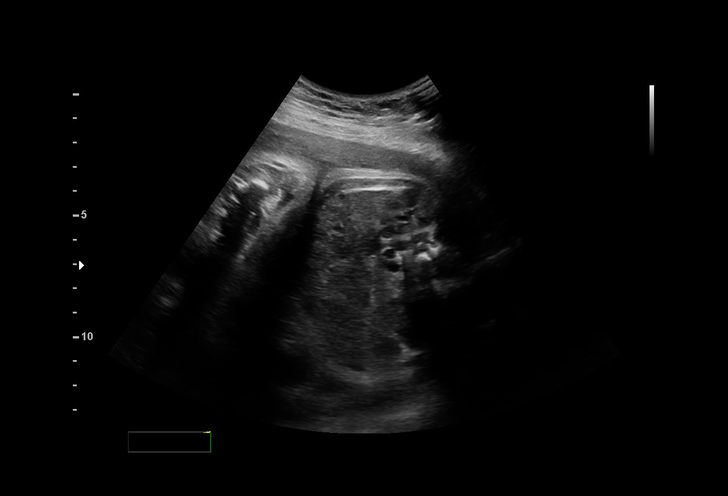
[im 16/19]
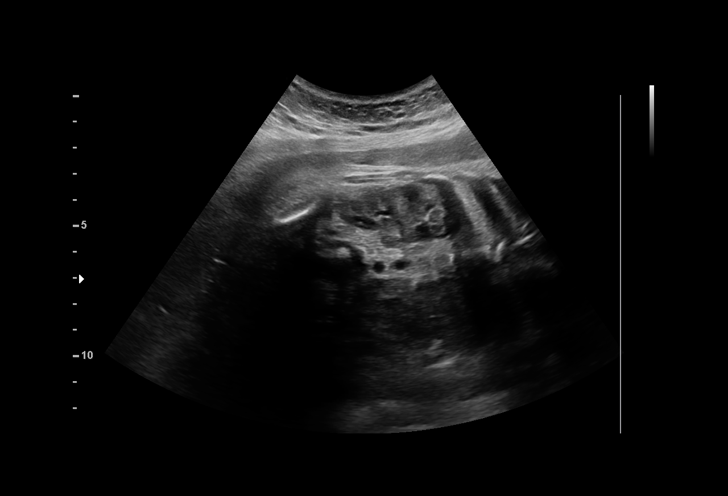
[im 17/19]
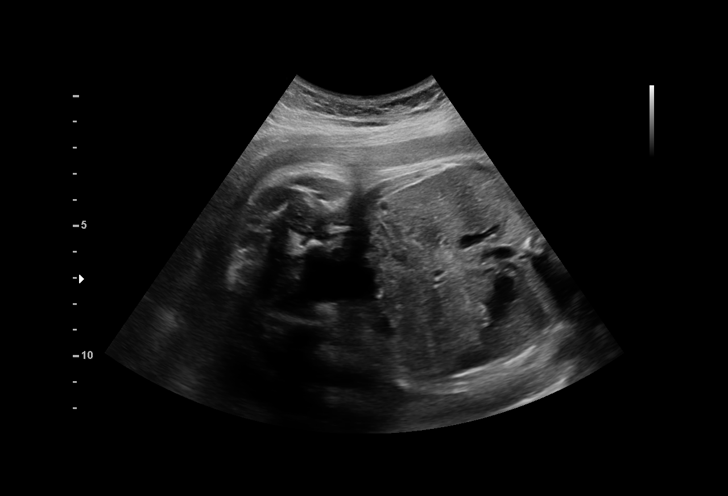
[im 19/19]
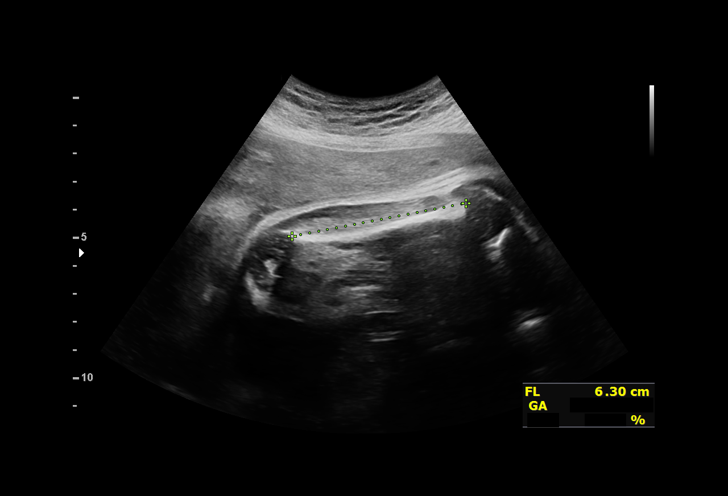

[14 of 19 positions shown; findings below may reference images not displayed]

Indications

 32 weeks gestation of pregnancy
 Obesity complicating pregnancy
 Advanced maternal age multigravida 35+,
 third trimester
 Echogenic intracardiac focus of the heart
 (EIF)
Fetal Evaluation

 Num Of Fetuses:         1
 Fetal Heart Rate(bpm):  145
 Cardiac Activity:       Observed
 Presentation:           Cephalic
 Placenta:               Posterior
 P. Cord Insertion:      Previously Visualized

 Amniotic Fluid
 AFI FV:      Within normal limits

 AFI Sum(cm)     %Tile       Largest Pocket(cm)
 14.32           50

 RUQ(cm)       RLQ(cm)       LUQ(cm)        LLQ(cm)

Biometry
 BPD:      86.5  mm     G. Age:  34w 6d         96  %    CI:        70.08   %    70 - 86
                                                         FL/HC:      19.7   %    19.1 -
 HC:      329.6  mm     G. Age:  37w 4d       > 99  %    HC/AC:      1.13        0.96 -
 AC:      292.1  mm     G. Age:  33w 1d         72  %    FL/BPD:     75.0   %    71 - 87
 FL:       64.9  mm     G. Age:  33w 3d         67  %    FL/AC:      22.2   %    20 - 24
 LV:        4.5  mm

 Est. FW:    8867  gm      5 lb 1 oz     83  %
OB History

 Blood Type:   B+
 Gravidity:    3         Term:   2
Gestational Age

 LMP:           31w 6d        Date:  09/06/20                 EDD:   06/13/21
 U/S Today:     34w 5d                                        EDD:   05/24/21
 Best:          32w 3d     Det. By:  Early Ultrasound         EDD:   06/09/21
                                     (10/31/20)
Anatomy

 Cranium:               Appears normal         Aortic Arch:            Appears normal
 Cavum:                 Previously seen        Ductal Arch:            Previously seen
 Ventricles:            Appears normal         Diaphragm:              Previously seen
 Choroid Plexus:        Previously seen        Stomach:                Appears normal, left
                                                                       sided
 Cerebellum:            Previously seen        Abdomen:                Previously seen
 Posterior Fossa:       Previously seen        Abdominal Wall:         Previously seen
 Nuchal Fold:           Previously seen        Cord Vessels:           Previously seen
 Face:                  Orbits and profile     Kidneys:                Appear normal
                        previously seen
 Lips:                  Previously seen        Bladder:                Appears normal
 Thoracic:              Previously seen        Spine:                  Appears normal
 Heart:                 Appears normal; EIF    Upper Extremities:      Appears normal
 RVOT:                  Previously seen        Lower Extremities:      Appears normal
 LVOT:                  Previously seen

 Other:  Male gender previously seen. VC, 3VV and 3VTV previously
         visualized.
Cervix Uterus Adnexa

 Cervix
 Not visualized (advanced GA >59wks)

 Right Ovary
 Visualized.

 Left Ovary
 Visualized.
Impression

 Follow up growth due to advanced maternal age.
 Normal interval growth with measurements consistent with
 dates
 Good fetal movement and amniotic fluid volume
Recommendations

 Follow up growth as clinically indicated.
